# Patient Record
Sex: Female | Born: 1940 | Race: White | Hispanic: No | Smoking: Never smoker
Health system: Southern US, Community
[De-identification: ages and names within clinical notes are randomized; demographics above are authoritative.]

## PROBLEM LIST (undated history)

## (undated) DIAGNOSIS — F32A Depression, unspecified: Secondary | ICD-10-CM

## (undated) DIAGNOSIS — E785 Hyperlipidemia, unspecified: Secondary | ICD-10-CM

## (undated) DIAGNOSIS — Z9221 Personal history of antineoplastic chemotherapy: Secondary | ICD-10-CM

## (undated) DIAGNOSIS — K5790 Diverticulosis of intestine, part unspecified, without perforation or abscess without bleeding: Secondary | ICD-10-CM

## (undated) DIAGNOSIS — E119 Type 2 diabetes mellitus without complications: Secondary | ICD-10-CM

## (undated) DIAGNOSIS — F329 Major depressive disorder, single episode, unspecified: Secondary | ICD-10-CM

## (undated) DIAGNOSIS — K219 Gastro-esophageal reflux disease without esophagitis: Secondary | ICD-10-CM

## (undated) DIAGNOSIS — C801 Malignant (primary) neoplasm, unspecified: Secondary | ICD-10-CM

## (undated) DIAGNOSIS — M810 Age-related osteoporosis without current pathological fracture: Secondary | ICD-10-CM

## (undated) DIAGNOSIS — C50919 Malignant neoplasm of unspecified site of unspecified female breast: Secondary | ICD-10-CM

## (undated) HISTORY — DX: Age-related osteoporosis without current pathological fracture: M81.0

## (undated) HISTORY — DX: Type 2 diabetes mellitus without complications: E11.9

## (undated) HISTORY — DX: Gastro-esophageal reflux disease without esophagitis: K21.9

## (undated) HISTORY — PX: CATARACT EXTRACTION: SUR2

## (undated) HISTORY — DX: Diverticulosis of intestine, part unspecified, without perforation or abscess without bleeding: K57.90

## (undated) HISTORY — PX: ABDOMINAL HYSTERECTOMY: SHX81

## (undated) HISTORY — PX: BREAST SURGERY: SHX581

---

## 2007-08-11 ENCOUNTER — Ambulatory Visit: Payer: Self-pay | Admitting: Internal Medicine

## 2007-09-29 DIAGNOSIS — C801 Malignant (primary) neoplasm, unspecified: Secondary | ICD-10-CM

## 2007-09-29 DIAGNOSIS — C50919 Malignant neoplasm of unspecified site of unspecified female breast: Secondary | ICD-10-CM

## 2007-09-29 HISTORY — DX: Malignant neoplasm of unspecified site of unspecified female breast: C50.919

## 2007-09-29 HISTORY — PX: BREAST LUMPECTOMY: SHX2

## 2007-09-29 HISTORY — DX: Malignant (primary) neoplasm, unspecified: C80.1

## 2007-11-12 ENCOUNTER — Ambulatory Visit: Payer: Self-pay | Admitting: Family Medicine

## 2007-12-30 ENCOUNTER — Ambulatory Visit: Payer: Self-pay | Admitting: Family Medicine

## 2008-01-27 ENCOUNTER — Ambulatory Visit: Payer: Self-pay | Admitting: Oncology

## 2008-02-10 ENCOUNTER — Ambulatory Visit: Payer: Self-pay | Admitting: Oncology

## 2008-02-16 ENCOUNTER — Ambulatory Visit: Payer: Self-pay | Admitting: Surgery

## 2008-02-16 ENCOUNTER — Other Ambulatory Visit: Payer: Self-pay

## 2008-02-17 ENCOUNTER — Ambulatory Visit: Payer: Self-pay | Admitting: Surgery

## 2008-02-27 ENCOUNTER — Ambulatory Visit: Payer: Self-pay | Admitting: Oncology

## 2008-03-28 ENCOUNTER — Ambulatory Visit: Payer: Self-pay | Admitting: Oncology

## 2008-04-09 ENCOUNTER — Inpatient Hospital Stay: Payer: Self-pay | Admitting: Oncology

## 2008-04-09 ENCOUNTER — Other Ambulatory Visit: Payer: Self-pay

## 2008-04-22 ENCOUNTER — Inpatient Hospital Stay: Payer: Self-pay | Admitting: Oncology

## 2008-04-22 ENCOUNTER — Other Ambulatory Visit: Payer: Self-pay

## 2008-04-28 ENCOUNTER — Ambulatory Visit: Payer: Self-pay | Admitting: Oncology

## 2008-05-29 ENCOUNTER — Ambulatory Visit: Payer: Self-pay | Admitting: Oncology

## 2008-05-31 ENCOUNTER — Ambulatory Visit: Payer: Self-pay | Admitting: Surgery

## 2008-06-28 ENCOUNTER — Ambulatory Visit: Payer: Self-pay | Admitting: Oncology

## 2008-06-30 ENCOUNTER — Other Ambulatory Visit: Payer: Self-pay

## 2008-06-30 ENCOUNTER — Emergency Department: Payer: Self-pay | Admitting: Emergency Medicine

## 2008-07-29 ENCOUNTER — Ambulatory Visit: Payer: Self-pay | Admitting: Oncology

## 2008-08-28 ENCOUNTER — Ambulatory Visit: Payer: Self-pay | Admitting: Oncology

## 2008-09-28 ENCOUNTER — Ambulatory Visit: Payer: Self-pay | Admitting: Oncology

## 2008-10-02 ENCOUNTER — Ambulatory Visit: Payer: Self-pay | Admitting: Family Medicine

## 2008-10-05 ENCOUNTER — Ambulatory Visit: Payer: Self-pay | Admitting: Oncology

## 2008-10-29 ENCOUNTER — Ambulatory Visit: Payer: Self-pay | Admitting: Oncology

## 2008-12-27 ENCOUNTER — Ambulatory Visit: Payer: Self-pay | Admitting: Oncology

## 2008-12-28 ENCOUNTER — Emergency Department: Payer: Self-pay | Admitting: Emergency Medicine

## 2008-12-31 ENCOUNTER — Ambulatory Visit: Payer: Self-pay | Admitting: Oncology

## 2009-01-04 ENCOUNTER — Ambulatory Visit: Payer: Self-pay | Admitting: Oncology

## 2009-01-26 ENCOUNTER — Ambulatory Visit: Payer: Self-pay | Admitting: Oncology

## 2009-03-21 ENCOUNTER — Ambulatory Visit: Payer: Self-pay | Admitting: Ophthalmology

## 2009-03-29 ENCOUNTER — Ambulatory Visit: Payer: Self-pay | Admitting: Oncology

## 2009-04-02 ENCOUNTER — Ambulatory Visit: Payer: Self-pay | Admitting: Ophthalmology

## 2009-04-28 ENCOUNTER — Ambulatory Visit: Payer: Self-pay | Admitting: Oncology

## 2009-06-28 ENCOUNTER — Ambulatory Visit: Payer: Self-pay | Admitting: Oncology

## 2009-07-03 ENCOUNTER — Ambulatory Visit: Payer: Self-pay | Admitting: Oncology

## 2009-07-12 ENCOUNTER — Ambulatory Visit: Payer: Self-pay | Admitting: Oncology

## 2009-07-29 ENCOUNTER — Ambulatory Visit: Payer: Self-pay | Admitting: Oncology

## 2009-08-24 ENCOUNTER — Ambulatory Visit: Payer: Self-pay | Admitting: Internal Medicine

## 2009-09-28 ENCOUNTER — Ambulatory Visit: Payer: Self-pay | Admitting: Oncology

## 2009-10-11 ENCOUNTER — Ambulatory Visit: Payer: Self-pay | Admitting: Oncology

## 2009-10-23 ENCOUNTER — Ambulatory Visit: Payer: Self-pay | Admitting: Internal Medicine

## 2009-10-29 ENCOUNTER — Ambulatory Visit: Payer: Self-pay | Admitting: Oncology

## 2009-11-26 ENCOUNTER — Ambulatory Visit: Payer: Self-pay | Admitting: Oncology

## 2009-12-01 ENCOUNTER — Ambulatory Visit: Payer: Self-pay | Admitting: Family Medicine

## 2009-12-27 ENCOUNTER — Ambulatory Visit: Payer: Self-pay | Admitting: Oncology

## 2010-01-02 ENCOUNTER — Ambulatory Visit: Payer: Self-pay | Admitting: Oncology

## 2010-01-10 ENCOUNTER — Ambulatory Visit: Payer: Self-pay | Admitting: Oncology

## 2010-01-13 ENCOUNTER — Ambulatory Visit: Payer: Self-pay | Admitting: Internal Medicine

## 2010-01-26 ENCOUNTER — Ambulatory Visit: Payer: Self-pay | Admitting: Oncology

## 2010-03-28 ENCOUNTER — Ambulatory Visit: Payer: Self-pay | Admitting: Oncology

## 2010-04-04 ENCOUNTER — Ambulatory Visit: Payer: Self-pay | Admitting: Oncology

## 2010-04-28 ENCOUNTER — Ambulatory Visit: Payer: Self-pay | Admitting: Oncology

## 2010-04-28 ENCOUNTER — Ambulatory Visit: Payer: Self-pay | Admitting: Internal Medicine

## 2010-06-28 ENCOUNTER — Ambulatory Visit: Payer: Self-pay | Admitting: Oncology

## 2010-07-04 ENCOUNTER — Ambulatory Visit: Payer: Self-pay | Admitting: Oncology

## 2010-07-14 ENCOUNTER — Ambulatory Visit: Payer: Self-pay | Admitting: Gastroenterology

## 2010-07-16 LAB — PATHOLOGY REPORT

## 2010-07-29 ENCOUNTER — Ambulatory Visit: Payer: Self-pay | Admitting: Oncology

## 2011-01-09 ENCOUNTER — Ambulatory Visit: Payer: Self-pay | Admitting: Oncology

## 2011-01-27 ENCOUNTER — Ambulatory Visit: Payer: Self-pay | Admitting: Oncology

## 2011-02-27 ENCOUNTER — Ambulatory Visit: Payer: Self-pay | Admitting: Oncology

## 2011-05-08 ENCOUNTER — Ambulatory Visit: Payer: Self-pay

## 2011-07-23 ENCOUNTER — Ambulatory Visit: Payer: Self-pay | Admitting: Oncology

## 2011-08-05 ENCOUNTER — Ambulatory Visit: Payer: Self-pay | Admitting: Oncology

## 2011-08-06 LAB — CANCER ANTIGEN 27.29: CA 27.29: 28.9 U/mL (ref 0.0–38.6)

## 2011-08-29 ENCOUNTER — Ambulatory Visit: Payer: Self-pay | Admitting: Oncology

## 2011-08-30 ENCOUNTER — Ambulatory Visit: Payer: Self-pay | Admitting: Internal Medicine

## 2011-10-03 ENCOUNTER — Ambulatory Visit: Payer: Self-pay

## 2011-12-13 ENCOUNTER — Ambulatory Visit: Payer: Self-pay | Admitting: Family Medicine

## 2012-01-28 ENCOUNTER — Ambulatory Visit: Payer: Self-pay | Admitting: Oncology

## 2012-02-03 ENCOUNTER — Ambulatory Visit: Payer: Self-pay | Admitting: Oncology

## 2012-02-27 ENCOUNTER — Ambulatory Visit: Payer: Self-pay | Admitting: Oncology

## 2012-04-02 ENCOUNTER — Ambulatory Visit: Payer: Self-pay

## 2012-04-23 ENCOUNTER — Ambulatory Visit: Payer: Self-pay | Admitting: Medical

## 2012-08-10 ENCOUNTER — Ambulatory Visit: Payer: Self-pay | Admitting: Oncology

## 2012-08-21 ENCOUNTER — Ambulatory Visit: Payer: Self-pay | Admitting: Medical

## 2012-08-21 LAB — URINALYSIS, COMPLETE
Bacteria: NEGATIVE
Glucose,UR: NEGATIVE mg/dL (ref 0–75)
Leukocyte Esterase: NEGATIVE
Nitrite: NEGATIVE
Specific Gravity: 1.015 (ref 1.003–1.030)

## 2012-08-21 LAB — AMYLASE: Amylase: 29 U/L (ref 25–115)

## 2012-08-21 LAB — COMPREHENSIVE METABOLIC PANEL
Albumin: 3.3 g/dL — ABNORMAL LOW (ref 3.4–5.0)
Alkaline Phosphatase: 63 U/L (ref 50–136)
BUN: 19 mg/dL — ABNORMAL HIGH (ref 7–18)
Bilirubin,Total: 0.5 mg/dL (ref 0.2–1.0)
Co2: 26 mmol/L (ref 21–32)
Creatinine: 0.82 mg/dL (ref 0.60–1.30)
EGFR (Non-African Amer.): >60
Glucose: 161 mg/dL — ABNORMAL HIGH (ref 65–99)
Potassium: 3.8 mmol/L (ref 3.5–5.1)
SGPT (ALT): 22 U/L (ref 12–78)
Sodium: 138 mmol/L (ref 136–145)

## 2012-08-21 LAB — CBC WITH DIFFERENTIAL/PLATELET
Eosinophil #: 0 10*3/uL (ref 0.0–0.7)
Eosinophil %: 0.5 %
HGB: 12.4 g/dL (ref 12.0–16.0)
Lymphocyte #: 0.4 10*3/uL — ABNORMAL LOW (ref 1.0–3.6)
MCHC: 34.2 g/dL (ref 32.0–36.0)
Monocyte #: 0.6 x10 3/mm (ref 0.2–0.9)
Neutrophil %: 75.6 %
Platelet: 145 10*3/uL — ABNORMAL LOW (ref 150–440)
RDW: 12.5 % (ref 11.5–14.5)

## 2012-08-21 LAB — LIPASE, BLOOD: Lipase: 102 U/L (ref 73–393)

## 2012-08-28 ENCOUNTER — Ambulatory Visit: Payer: Self-pay | Admitting: Oncology

## 2012-10-08 ENCOUNTER — Ambulatory Visit: Payer: Self-pay | Admitting: Family Medicine

## 2012-10-08 LAB — URINALYSIS, COMPLETE
Bilirubin,UR: NEGATIVE
Ketone: NEGATIVE
RBC,UR: NONE SEEN /HPF (ref 0–5)

## 2013-01-31 ENCOUNTER — Ambulatory Visit: Payer: Self-pay | Admitting: Oncology

## 2013-02-08 ENCOUNTER — Ambulatory Visit: Payer: Self-pay | Admitting: Oncology

## 2013-02-09 LAB — CANCER ANTIGEN 27.29: CA 27.29: 36 U/mL (ref 0.0–38.6)

## 2013-02-26 ENCOUNTER — Ambulatory Visit: Payer: Self-pay | Admitting: Oncology

## 2013-05-17 ENCOUNTER — Ambulatory Visit: Payer: Self-pay | Admitting: Family Medicine

## 2013-05-25 ENCOUNTER — Ambulatory Visit: Payer: Self-pay | Admitting: Family Medicine

## 2013-06-28 ENCOUNTER — Ambulatory Visit: Payer: Self-pay | Admitting: Oncology

## 2013-08-16 ENCOUNTER — Ambulatory Visit: Payer: Self-pay | Admitting: Oncology

## 2013-08-17 LAB — CANCER ANTIGEN 27.29: CA 27.29: 40.5 U/mL — ABNORMAL HIGH (ref 0.0–38.6)

## 2013-08-28 ENCOUNTER — Ambulatory Visit: Payer: Self-pay | Admitting: Oncology

## 2013-09-16 ENCOUNTER — Ambulatory Visit: Payer: Self-pay | Admitting: Physician Assistant

## 2013-09-16 LAB — SEDIMENTATION RATE: Erythrocyte Sed Rate: 14 mm/hr (ref 0–30)

## 2014-02-15 ENCOUNTER — Ambulatory Visit: Payer: Self-pay | Admitting: Oncology

## 2014-02-21 ENCOUNTER — Ambulatory Visit: Payer: Self-pay | Admitting: Oncology

## 2014-02-22 LAB — CANCER ANTIGEN 27.29: CA 27.29: 45.2 U/mL — AB (ref 0.0–38.6)

## 2014-02-26 ENCOUNTER — Ambulatory Visit: Payer: Self-pay | Admitting: Oncology

## 2014-05-02 ENCOUNTER — Ambulatory Visit: Payer: Self-pay | Admitting: Oncology

## 2014-05-03 LAB — CANCER ANTIGEN 27.29: CA 27.29: 51.5 U/mL — AB (ref 0.0–38.6)

## 2014-05-27 ENCOUNTER — Ambulatory Visit: Payer: Self-pay | Admitting: Internal Medicine

## 2014-05-27 LAB — URINALYSIS, COMPLETE
Bilirubin,UR: NEGATIVE
GLUCOSE, UR: NEGATIVE
KETONE: NEGATIVE
Nitrite: NEGATIVE
Ph: 7 (ref 5.0–8.0)
Protein: NEGATIVE
SPECIFIC GRAVITY: 1.01 (ref 1.000–1.030)
WBC UR: >30 /HPF (ref 0–5)

## 2014-05-29 ENCOUNTER — Ambulatory Visit: Payer: Self-pay | Admitting: Oncology

## 2014-05-31 LAB — URINE CULTURE

## 2014-06-23 ENCOUNTER — Ambulatory Visit: Payer: Self-pay | Admitting: Physician Assistant

## 2014-06-29 ENCOUNTER — Ambulatory Visit: Payer: Self-pay | Admitting: Oncology

## 2014-07-04 ENCOUNTER — Ambulatory Visit: Payer: Self-pay | Admitting: Oncology

## 2014-07-05 LAB — CANCER ANTIGEN 27.29: CA 27.29: 48.1 U/mL — ABNORMAL HIGH (ref 0.0–38.6)

## 2014-07-22 ENCOUNTER — Ambulatory Visit: Payer: Self-pay | Admitting: Internal Medicine

## 2014-07-29 ENCOUNTER — Ambulatory Visit: Payer: Self-pay | Admitting: Oncology

## 2014-10-04 ENCOUNTER — Ambulatory Visit: Payer: Self-pay | Admitting: Oncology

## 2015-01-04 ENCOUNTER — Ambulatory Visit
Admit: 2015-01-04 | Disposition: A | Discharge status: Home / Self Care | Payer: Self-pay | Attending: Oncology | Admitting: Oncology

## 2015-01-05 LAB — CANCER ANTIGEN 27.29: CA 27.29: 43.4 U/mL — ABNORMAL HIGH (ref 0.0–38.6)

## 2015-02-10 ENCOUNTER — Ambulatory Visit
Admission: EM | Admit: 2015-02-10 | Discharge: 2015-02-10 | Disposition: A | Discharge status: Home / Self Care | Payer: Medicare Other | Attending: Family Medicine | Admitting: Family Medicine

## 2015-02-10 ENCOUNTER — Encounter: Payer: Self-pay | Admitting: Gynecology

## 2015-02-10 DIAGNOSIS — R51 Headache: Secondary | ICD-10-CM

## 2015-02-10 DIAGNOSIS — R519 Headache, unspecified: Secondary | ICD-10-CM

## 2015-02-10 DIAGNOSIS — H6981 Other specified disorders of Eustachian tube, right ear: Secondary | ICD-10-CM | POA: Diagnosis not present

## 2015-02-10 DIAGNOSIS — J0100 Acute maxillary sinusitis, unspecified: Secondary | ICD-10-CM

## 2015-02-10 HISTORY — DX: Hyperlipidemia, unspecified: E78.5

## 2015-02-10 HISTORY — DX: Depression, unspecified: F32.A

## 2015-02-10 HISTORY — DX: Malignant (primary) neoplasm, unspecified: C80.1

## 2015-02-10 HISTORY — DX: Major depressive disorder, single episode, unspecified: F32.9

## 2015-02-10 MED ORDER — FEXOFENADINE-PSEUDOEPHED ER 60-120 MG PO TB12: 1.0000 | ORAL_TABLET | Freq: Two times a day (BID) | ORAL | Status: DC

## 2015-02-10 MED ORDER — FLUTICASONE PROPIONATE 50 MCG/ACT NA SUSP: 2.0000 | Freq: Every day | NASAL | Status: DC

## 2015-02-10 MED ORDER — AMOXICILLIN-POT CLAVULANATE 875-125 MG PO TABS: 1.0000 | ORAL_TABLET | Freq: Two times a day (BID) | ORAL | Status: DC

## 2015-02-10 NOTE — Discharge Instructions (Signed)
Sinusitis Sinusitis is redness, soreness, and puffiness (inflammation) of the air pockets in the bones of your face (sinuses). The redness, soreness, and puffiness can cause air and mucus to get trapped in your sinuses. This can allow germs to grow and cause an infection.  HOME CARE   Drink enough fluids to keep your pee (urine) clear or pale yellow.  Use a humidifier in your home.  Run a hot shower to create steam in the bathroom. Sit in the bathroom with the door closed. Breathe in the steam 3-4 times a day.  Put a warm, moist washcloth on your face 3-4 times a day, or as told by your doctor.  Use salt water sprays (saline sprays) to wet the thick fluid in your nose. This can help the sinuses drain.  Only take medicine as told by your doctor. GET HELP RIGHT AWAY IF:   Your pain gets worse.  You have very bad headaches.  You are sick to your stomach (nauseous).  You throw up (vomit).  You are very sleepy (drowsy) all the time.  Your face is puffy (swollen).  Your vision changes.  You have a stiff neck.  You have trouble breathing. MAKE SURE YOU:   Understand these instructions.  Will watch your condition.  Will get help right away if you are not doing well or get worse. Document Released: 03/02/2008 Document Revised: 06/08/2012 Document Reviewed: 04/19/2012 [facility] Patient Information 2015 [place], [place]. This information is not intended to replace advice given to you by your health care provider. Make sure you discuss any questions you have with your health care provider. Barotitis Media Barotitis media is inflammation of your middle ear. This occurs when the auditory tube (eustachian tube) leading from the back of your nose (nasopharynx) to your eardrum is blocked. This blockage may result from a cold, environmental allergies, or an upper respiratory infection. Unresolved barotitis media may lead to damage or hearing loss (barotrauma), which may become  permanent. HOME CARE INSTRUCTIONS   Use medicines as recommended by your health care provider. Over-the-counter medicines will help unblock the canal and can help during times of air travel.  Do not put anything into your ears to clean or unplug them. Eardrops will not be helpful.  Do not swim, dive, or fly until your health care provider says it is all right to do so. If these activities are necessary, chewing gum with frequent, forceful swallowing may help. It is also helpful to hold your nose and gently blow to pop your ears for equalizing pressure changes. This forces air into the eustachian tube.  Only take over-the-counter or prescription medicines for pain, discomfort, or fever as directed by your health care provider.  A decongestant may be helpful in decongesting the middle ear and make pressure equalization easier. SEEK MEDICAL CARE IF:  You experience a serious form of dizziness in which you feel as if the room is spinning and you feel nauseated (vertigo).  Your symptoms only involve one ear. SEEK IMMEDIATE MEDICAL CARE IF:   You develop a severe headache, dizziness, or severe ear pain.  You have bloody or pus-like drainage from your ears.  You develop a fever.  Your problems do not improve or become worse. MAKE SURE YOU:   Understand these instructions.  Will watch your condition.  Will get help right away if you are not doing well or get worse. Document Released: 09/11/2000 Document Revised: 07/05/2013 Document Reviewed: 04/11/2013 [facility] Patient Information 2015 [place], [place]. This information is  not intended to replace advice given to you by your health care provider. Make sure you discuss any questions you have with your health care provider.

## 2015-02-10 NOTE — ED Provider Notes (Signed)
CSN: [id number]     Arrival date & time 02/10/15  0803 History   First MD Initiated Contact with Patient 02/10/15 [phone]     Chief Complaint  Patient presents with  . Headache  . Otalgia   (Consider location/radiation/quality/duration/timing/severity/associated sxs/prior Treatment) Patient is a 74 y.o. female presenting with headaches and ear pain. The history is provided by the patient. No language interpreter was used.  Headache Pain location:  Frontal Quality:  Dull Radiates to:  Eyes and face Onset quality:  Unable to specify Duration:  4 days Timing:  Intermittent Progression:  Unchanged Chronicity:  New Similar to prior headaches: no   Context: activity   Relieved by:  Nothing Worsened by:  Nothing Ineffective treatments:  None tried Associated symptoms: congestion, drainage and ear pain   Congestion:    Location:  Nasal   Interferes with sleep: no     Interferes with eating/drinking: no   Risk factors: no anger, no family hx of SAH and does not have insomnia   Otalgia Associated symptoms: congestion and headaches     Past Medical History  Diagnosis Date  . Cancer     breast   . Depression   . Hyperlipemia    Past Surgical History  Procedure Laterality Date  . Breast surgery      left breast cancer  . Abdominal hysterectomy     Family History  Problem Relation Age of Onset  . Cancer Father    History  Substance Use Topics  . Smoking status: Never Smoker   . Smokeless tobacco: Not on file  . Alcohol Use: No   OB History    No data available     Review of Systems  HENT: Positive for congestion, ear pain and postnasal drip.   Neurological: Positive for headaches.    Allergies  Review of patient's allergies indicates no known allergies.  Home Medications   Prior to Admission medications   Medication Sig Start Date End Date Taking? Authorizing Provider  citalopram (CELEXA) 40 MG tablet Take 40 mg by mouth daily.   Yes Historical Provider, MD   amoxicillin-clavulanate (AUGMENTIN) 875-125 MG per tablet Take 1 tablet by mouth 2 (two) times daily. 02/10/15   Frederich Cha, MD  fexofenadine-pseudoephedrine (ALLEGRA-D) 60-120 MG per tablet Take 1 tablet by mouth every 12 (twelve) hours. 02/10/15   Frederich Cha, MD  fluticasone (FLONASE) 50 MCG/ACT nasal spray Place 2 sprays into both nostrils daily. 02/10/15   Frederich Cha, MD   BP 117/72 mmHg  Pulse 78  Temp(Src) 96.5 F (35.8 C) (Tympanic)  Ht 4\' 10"  (1.473 m)  Wt 123 lb (55.792 kg)  BMI 25.71 kg/m2  SpO2 98% Physical Exam  Constitutional: She is oriented to person, place, and time. She appears well-nourished.  HENT:  Head: Normocephalic.  Right Ear: Tympanic membrane is bulging. Tympanic membrane is not perforated and not erythematous.  Left Ear: Tympanic membrane is not perforated and not erythematous.  Nose: Mucosal edema present. Right sinus exhibits maxillary sinus tenderness. Left sinus exhibits maxillary sinus tenderness.  Mouth/Throat: No oral lesions. No dental abscesses, uvula swelling, lacerations or dental caries.  Eyes: Pupils are equal, round, and reactive to light.  Neck: Neck supple.  Neurological: She is oriented to person, place, and time.    ED Course  Procedures (including critical care time) Labs Review Labs Reviewed - No data to display  Imaging Review No results found.   Pa she has been informed that she has eustachian tube dysfunction.  Normal decongestant nasal spray does track well and antibiotic is having a headache.  MDM   1. Eustachian tube dysfunction, right   2. Acute nonintractable headache, unspecified headache type   3. Acute maxillary sinusitis, recurrence not specified        Frederich Cha, MD 02/10/15 [phone]

## 2015-02-10 NOTE — ED Notes (Signed)
Patient c/o right ear pain and headaches x 3-4 days.

## 2015-02-24 ENCOUNTER — Encounter: Payer: Self-pay | Admitting: Gynecology

## 2015-02-24 ENCOUNTER — Ambulatory Visit
Admission: EM | Admit: 2015-02-24 | Discharge: 2015-02-24 | Disposition: A | Discharge status: Home / Self Care | Payer: Medicare Other | Attending: Family Medicine | Admitting: Family Medicine

## 2015-02-24 DIAGNOSIS — R51 Headache: Secondary | ICD-10-CM | POA: Diagnosis not present

## 2015-02-24 DIAGNOSIS — R05 Cough: Secondary | ICD-10-CM

## 2015-02-24 DIAGNOSIS — R519 Headache, unspecified: Secondary | ICD-10-CM

## 2015-02-24 DIAGNOSIS — R059 Cough, unspecified: Secondary | ICD-10-CM

## 2015-02-24 MED ORDER — HYDROCOD POLST-CPM POLST ER 10-8 MG/5ML PO SUER: 5.0000 mL | Freq: Two times a day (BID) | ORAL | Status: DC

## 2015-02-24 NOTE — ED Notes (Signed)
Patient c/o of coughing and headaches x 1 week. Pt. Also stated notice bumps under her tongue.

## 2015-02-24 NOTE — ED Provider Notes (Signed)
CSN: [id number]     Arrival date & time 02/24/15  0803 History   First MD Initiated Contact with Patient 02/24/15 [phone]     Chief Complaint  Patient presents with  . Cough  . Headache   (Consider location/radiation/quality/duration/timing/severity/associated sxs/prior Treatment) HPI Comments: 74 yo female presents with a 1-2 weeks h/o cough and headaches. Patient was seen about 2 weeks ago and was treated for a sinusitis with Augmentin which she just finished about 5 days ago. States some of those symptoms have improved but continues with headaches and cough. Cough is dry, non-productive.  Denies any fevers, chills, chest pains, shortness of breath, vision problems, numbness, tingling, one-sided weakness, speech or swallowing problems.   The history is provided by the patient.    Past Medical History  Diagnosis Date  . Cancer     breast   . Depression   . Hyperlipemia    Past Surgical History  Procedure Laterality Date  . Breast surgery      left breast cancer  . Abdominal hysterectomy     Family History  Problem Relation Age of Onset  . Cancer Father    History  Substance Use Topics  . Smoking status: Never Smoker   . Smokeless tobacco: Not on file  . Alcohol Use: No   OB History    No data available     Review of Systems  Allergies  Review of patient's allergies indicates no known allergies.  Home Medications   Prior to Admission medications   Medication Sig Start Date End Date Taking? Authorizing Provider  citalopram (CELEXA) 40 MG tablet Take 40 mg by mouth daily.   Yes Historical Provider, MD  fexofenadine-pseudoephedrine (ALLEGRA-D) 60-120 MG per tablet Take 1 tablet by mouth every 12 (twelve) hours. 02/10/15  Yes Frederich Cha, MD  fluticasone [facility]) 50 MCG/ACT nasal spray Place 2 sprays into both nostrils daily. 02/10/15  Yes Frederich Cha, MD  amoxicillin-clavulanate (AUGMENTIN) 875-125 MG per tablet Take 1 tablet by mouth 2 (two) times daily. 02/10/15    Frederich Cha, MD  chlorpheniramine-HYDROcodone [facility] PENNKINETIC ER) 10-8 MG/5ML SUER Take 5 mLs by mouth 2 (two) times daily. 02/24/15   Norval Gable, MD   BP 141/67 mmHg  Pulse 72  Temp(Src) 98.9 F (37.2 C) (Oral)  Ht 4\' 11"  (1.499 m)  Wt 121 lb (54.885 kg)  BMI 24.43 kg/m2  SpO2 98% Physical Exam  Constitutional: She is oriented to person, place, and time. She appears well-developed and well-nourished. No distress.  HENT:  Head: Normocephalic.  Right Ear: Tympanic membrane, external ear and ear canal normal.  Left Ear: Tympanic membrane, external ear and ear canal normal.  Nose: Nose normal.  Mouth/Throat: Oropharynx is clear and moist and mucous membranes are normal.  Eyes: Conjunctivae and EOM are normal. Pupils are equal, round, and reactive to light. Right eye exhibits no discharge. Left eye exhibits no discharge. No scleral icterus.  Neck: Normal range of motion. Neck supple. No JVD present. No tracheal deviation present. No thyromegaly present.  Cardiovascular: Normal rate, regular rhythm, normal heart sounds and intact distal pulses.   No murmur heard. Pulmonary/Chest: Effort normal and breath sounds normal. No stridor. No respiratory distress. She has no wheezes. She has no rales.  Lymphadenopathy:    She has no cervical adenopathy.  Neurological: She is alert and oriented to person, place, and time. She displays normal reflexes. No cranial nerve deficit. She exhibits normal muscle tone. Coordination normal.  Skin: Skin is warm and  dry. No rash noted. She is not diaphoretic. No erythema.  Vitals reviewed.   ED Course  Procedures (including critical care time) Labs Review Labs Reviewed - No data to display  Imaging Review No results found.   MDM   1. Cough   2. Headache disorder    Discharge Medication List as of 02/24/2015  9:05 AM    START taking these medications   Details  chlorpheniramine-HYDROcodone (TUSSIONEX PENNKINETIC ER) 10-8 MG/5ML SUER Take  5 mLs by mouth 2 (two) times daily., Starting 02/24/2015, Until Discontinued, Normal      Plan: 1. Test/x-ray results and diagnosis reviewed with patient 2. rx as per orders; risks, benefits, potential side effects reviewed with patient 3. Recommend supportive treatment with  4. F/u prn if symptoms worsen or don't improve    Norval Gable, MD 02/24/15 [phone]

## 2015-05-13 ENCOUNTER — Other Ambulatory Visit: Payer: Self-pay | Admitting: *Deleted

## 2015-05-13 DIAGNOSIS — C50919 Malignant neoplasm of unspecified site of unspecified female breast: Secondary | ICD-10-CM

## 2015-08-24 ENCOUNTER — Encounter: Payer: Self-pay | Admitting: Gynecology

## 2015-08-24 ENCOUNTER — Ambulatory Visit
Admission: EM | Admit: 2015-08-24 | Discharge: 2015-08-24 | Disposition: A | Discharge status: Home / Self Care | Payer: Medicare Other | Attending: Family Medicine | Admitting: Family Medicine

## 2015-08-24 DIAGNOSIS — R51 Headache: Secondary | ICD-10-CM | POA: Diagnosis not present

## 2015-08-24 DIAGNOSIS — J0100 Acute maxillary sinusitis, unspecified: Secondary | ICD-10-CM

## 2015-08-24 DIAGNOSIS — R600 Localized edema: Secondary | ICD-10-CM

## 2015-08-24 DIAGNOSIS — R21 Rash and other nonspecific skin eruption: Secondary | ICD-10-CM | POA: Diagnosis not present

## 2015-08-24 DIAGNOSIS — R519 Headache, unspecified: Secondary | ICD-10-CM

## 2015-08-24 MED ORDER — FEXOFENADINE-PSEUDOEPHED ER 180-240 MG PO TB24: 1.0000 | ORAL_TABLET | Freq: Every day | ORAL | Status: DC

## 2015-08-24 MED ORDER — AMOXICILLIN-POT CLAVULANATE 875-125 MG PO TABS: 1.0000 | ORAL_TABLET | Freq: Two times a day (BID) | ORAL | Status: DC

## 2015-08-24 MED ORDER — PREDNISONE 10 MG (21) PO TBPK: ORAL_TABLET | ORAL | Status: DC

## 2015-08-24 NOTE — ED Provider Notes (Signed)
CSN: [id number]     Arrival date & time 08/24/15  [id number] History   First MD Initiated Contact with Patient 08/24/15 0830     Nurses notes were reviewed. Chief Complaint  Patient presents with  . Facial Pain  . Rash   patient is here several complaints. She states she is having sinus pressure and sinus pain. However when trying to clarify what she asked me about sinus pain Sunday say much. She states that she's had trouble also normal when asked exactly when this gets worse she can't really give me any strong details. Apparently back in the early spring she was seen for nasal congestion placed on Augmentin and Allegra-D and improved. Ask about whether this is all allergies and she can't really say.   She is also complaining of swelling in her left leg. This is the side that she had breast cancer on but has no history of hypertension and she can't would tell me how long that left leg has been swelling. Asked whether she's had any electrolytes or blood done lately she has not she's not seen a primary care doctor and she states until last year 2015 and she states she needs to make appointment to be seen sometime in 2016 which she is almost over with now. She also states she has a rash on the left lower leg looks like this more of a pruritic rash is red and where she may been scratching (Consider location/radiation/quality/duration/timing/severity/associated sxs/prior Treatment) Patient is a 74 y.o. female presenting with rash and URI. The history is provided by the patient.  Rash Location:  Leg Leg rash location:  L leg Quality: swelling   Severity:  Moderate Onset quality:  Sudden Timing:  Unable to specify Progression:  Unable to specify Chronicity:  New Context: not animal contact, not chemical exposure, not exposure to similar rash, not plant contact, not pollen, not pregnancy, not sick contacts and not sun exposure   Ineffective treatments:  None tried Associated symptoms: headaches    Associated symptoms: no fever and no sore throat   URI Presenting symptoms: congestion, ear pain and rhinorrhea   Presenting symptoms: no fever and no sore throat   Congestion:    Location:  Nasal   Interferes with sleep: no   Onset quality:  Unable to specify Timing:  Unable to specify Chronicity:  Chronic Relieved by:  Nothing Worsened by:  Nothing tried Associated symptoms: headaches     Past Medical History  Diagnosis Date  . Cancer ([place])     breast   . Depression   . Hyperlipemia    Past Surgical History  Procedure Laterality Date  . Breast surgery      left breast cancer  . Abdominal hysterectomy     Family History  Problem Relation Age of Onset  . Cancer Father    Social History  Substance Use Topics  . Smoking status: Never Smoker   . Smokeless tobacco: None  . Alcohol Use: No   OB History    No data available     Review of Systems  Constitutional: Negative for fever.  HENT: Positive for congestion, ear pain and rhinorrhea. Negative for sore throat.   Skin: Positive for rash.  Neurological: Positive for headaches.    Allergies  Review of patient's allergies indicates no known allergies.  Home Medications   Prior to Admission medications   Medication Sig Start Date End Date Taking? Authorizing Provider  Calcium Carbonate-Vit D-Min (CALCIUM 1200 PO) Take by mouth.  Yes Historical Provider, MD  citalopram (CELEXA) 40 MG tablet Take 40 mg by mouth daily.   Yes Historical Provider, MD  fexofenadine-pseudoephedrine (ALLEGRA-D) 60-120 MG per tablet Take 1 tablet by mouth every 12 (twelve) hours. 02/10/15  Yes Frederich Cha, MD  amoxicillin-clavulanate (AUGMENTIN) 875-125 MG per tablet Take 1 tablet by mouth 2 (two) times daily. 02/10/15   Frederich Cha, MD  amoxicillin-clavulanate (AUGMENTIN) 875-125 MG tablet Take 1 tablet by mouth 2 (two) times daily. 08/24/15   Frederich Cha, MD  chlorpheniramine-HYDROcodone [facility] PENNKINETIC ER) 10-8 MG/5ML SUER Take  5 mLs by mouth 2 (two) times daily. 02/24/15   Norval Gable, MD  fexofenadine-pseudoephedrine (ALLEGRA-D [facility]ERGY [facility] CONGESTION) 180-240 MG 24 hr tablet Take 1 tablet by mouth daily. 08/24/15   Frederich Cha, MD  fluticasone (FLONASE) 50 MCG/ACT nasal spray Place 2 sprays into both nostrils daily. 02/10/15   Frederich Cha, MD  predniSONE (STERAPRED UNI-PAK 21 TAB) 10 MG (21) TBPK tablet Sig 6 tablet day 1, 5 tablets day 2, 4 tablets day 3,,3tablets day 4, 2 tablets day 5, 1 tablet day 6 take all tablets orally 08/24/15   Frederich Cha, MD   Meds Ordered and Administered this Visit  Medications - No data to display  BP 133/54 mmHg  Pulse 66  Temp(Src) 98.7 F (37.1 C) (Oral)  Resp 16  Wt 121 lb (54.885 kg)  SpO2 98% No data found.   Physical Exam  Constitutional: She is oriented to person, place, and time. She appears well-developed and well-nourished.  HENT:  Head: Normocephalic and atraumatic.  Nose: Nose normal.  Eyes: Pupils are equal, round, and reactive to light.  Neck: Neck supple.  Cardiovascular: Normal rate, regular rhythm and normal heart sounds.   Pulmonary/Chest: Effort normal and breath sounds normal.  Musculoskeletal: Normal range of motion. She exhibits edema and tenderness.  Neurological: She is alert and oriented to person, place, and time.  Skin: Rash noted.  Psychiatric: She has a normal mood and affect.  Vitals reviewed.   ED Course  Procedures (including critical care time)  Labs Review Labs Reviewed - No data to display  Imaging Review No results found.   Visual Acuity Review  Right Eye Distance:   Left Eye Distance:   Bilateral Distance:    Right Eye Near:   Left Eye Near:    Bilateral Near:         MDM   1. Acute maxillary sinusitis, recurrence not specified   2. Facial pain   3. Rash and nonspecific skin eruption   4. Leg edema, left      #1 sinusitis/ facial pain. Despite the lack of history obtained appears that Augmentin we'll  placed on that in this early spring helped we'll go ahead and place her on another round of Augmentin and Allegra-D. Also concerned about the swelling of the face with the steroids may be helpful.  #2 leg swelling questions whether the leg is swollen and she is scratching the leg and now vastus gotten the leg red or whether she has a rash on leg and this was causing swelling. Since she does have a question of allergies I will place her on a six-day course of prednisone sit-ups the rash may such great help swelling I do not feel couple trying a diuretic at this time since we have no lab work and is no signs of hypertension run stress strongly that she talk with her PCP and see him for reevaluation.  Frederich Cha, MD 08/24/15 [phone]

## 2015-08-24 NOTE — Discharge Instructions (Signed)
Allergies An allergy is when your body reacts to a substance in a way that is not normal. An allergic reaction can happen after you:  Eat something.  Breathe in something.  Touch something. WHAT KINDS OF ALLERGIES ARE THERE? You can be allergic to:  Things that are only around during certain seasons, like molds and pollens.  Foods.  Drugs.  Insects.  Animal dander. WHAT ARE SYMPTOMS OF ALLERGIES?  Puffiness (swelling). This may happen on the lips, face, tongue, mouth, or throat.  Sneezing.  Coughing.  Breathing loudly (wheezing).  Stuffy nose.  Tingling in the mouth.  A rash.  Itching.  Itchy, red, puffy areas of skin (hives).  Watery eyes.  Throwing up (vomiting).  Watery poop (diarrhea).  Dizziness.  Feeling faint or fainting.  Trouble breathing or swallowing.  A tight feeling in the chest.  A fast heartbeat. HOW ARE ALLERGIES DIAGNOSED? Allergies can be diagnosed with:  A medical and family history.  Skin tests.  Blood tests.  A food diary. A food diary is a record of all the foods, drinks, and symptoms you have each day.  The results of an elimination diet. This diet involves making sure not to eat certain foods and then seeing what happens when you start eating them again. HOW ARE ALLERGIES TREATED? There is no cure for allergies, but allergic reactions can be treated with medicine. Severe reactions usually need to be treated at a hospital.  HOW CAN REACTIONS BE PREVENTED? The best way to prevent an allergic reaction is to avoid the thing you are allergic to. Allergy shots and medicines can also help prevent reactions in some cases.   This information is not intended to replace advice given to you by your health care provider. Make sure you discuss any questions you have with your health care provider.   Document Released: 01/09/2013 Document Revised: 10/05/2014 Document Reviewed: 06/26/2014 Elsevier Interactive Patient Education 2016  [employer]  Edema Edema is an abnormal buildup of fluids. It is more common in your legs and thighs. Painless swelling of the feet and ankles is more likely as a person ages. It also is common in looser skin, like around your eyes. HOME CARE   Keep the affected body part above the level of the heart while lying down.  Do not sit still or stand for a long time.  Do not put anything right under your knees when you lie down.  Do not wear tight clothes on your upper legs.  Exercise your legs to help the puffiness (swelling) go down.  Wear elastic bandages or support stockings as told by your doctor.  A low-salt diet may help lessen the puffiness.  Only take medicine as told by your doctor. GET HELP IF:  Treatment is not working.  You have heart, liver, or kidney disease and notice that your skin looks puffy or shiny.  You have puffiness in your legs that does not get better when you raise your legs.  You have sudden weight gain for no reason. GET HELP RIGHT AWAY IF:   You have shortness of breath or chest pain.  You cannot breathe when you lie down.  You have pain, redness, or warmth in the areas that are puffy.  You have heart, liver, or kidney disease and get edema all of a sudden.  You have a fever and your symptoms get worse all of a sudden. MAKE SURE YOU:   Understand these instructions.  Will watch your condition.  Will  get help right away if you are not doing well or get worse.   This information is not intended to replace advice given to you by your health care provider. Make sure you discuss any questions you have with your health care provider.   Document Released: 03/02/2008 Document Revised: 09/19/2013 Document Reviewed: 07/07/2013 Elsevier Interactive Patient Education 2016 Elsevier Inc.  Sinusitis, Adult Sinusitis is redness, soreness, and puffiness (inflammation) of the air pockets in the bones of your face (sinuses). The redness, soreness, and  puffiness can cause air and mucus to get trapped in your sinuses. This can allow germs to grow and cause an infection.  HOME CARE   Drink enough fluids to keep your pee (urine) clear or pale yellow.  Use a humidifier in your home.  Run a hot shower to create steam in the bathroom. Sit in the bathroom with the door closed. Breathe in the steam 3-4 times a day.  Put a warm, moist washcloth on your face 3-4 times a day, or as told by your doctor.  Use salt water sprays (saline sprays) to wet the thick fluid in your nose. This can help the sinuses drain.  Only take medicine as told by your doctor. GET HELP RIGHT AWAY IF:   Your pain gets worse.  You have very bad headaches.  You are sick to your stomach (nauseous).  You throw up (vomit).  You are very sleepy (drowsy) all the time.  Your face is puffy (swollen).  Your vision changes.  You have a stiff neck.  You have trouble breathing. MAKE SURE YOU:   Understand these instructions.  Will watch your condition.  Will get help right away if you are not doing well or get worse.   This information is not intended to replace advice given to you by your health care provider. Make sure you discuss any questions you have with your health care provider.   Document Released: 03/02/2008 Document Revised: 10/05/2014 Document Reviewed: 04/19/2012 Elsevier Interactive Patient Education [employer].

## 2015-08-24 NOTE — ED Notes (Signed)
Patient c/o sinus infection and rash/swelling on left ankle x 2 weeks.

## 2016-01-02 ENCOUNTER — Other Ambulatory Visit: Payer: Self-pay | Admitting: Oncology

## 2016-01-02 DIAGNOSIS — C50912 Malignant neoplasm of unspecified site of left female breast: Secondary | ICD-10-CM

## 2016-01-03 ENCOUNTER — Inpatient Hospital Stay: Payer: Medicare Other | Attending: Oncology

## 2016-01-03 ENCOUNTER — Ambulatory Visit (HOSPITAL_BASED_OUTPATIENT_CLINIC_OR_DEPARTMENT_OTHER): Payer: Medicare Other | Admitting: Oncology

## 2016-01-03 ENCOUNTER — Encounter: Payer: Self-pay | Admitting: Oncology

## 2016-01-03 VITALS — BP 137/84 | HR 65 | Temp 97.3°F | Resp 16 | Wt 130.1 lb

## 2016-01-03 DIAGNOSIS — Z79899 Other long term (current) drug therapy: Secondary | ICD-10-CM | POA: Insufficient documentation

## 2016-01-03 DIAGNOSIS — K219 Gastro-esophageal reflux disease without esophagitis: Secondary | ICD-10-CM | POA: Diagnosis not present

## 2016-01-03 DIAGNOSIS — K5790 Diverticulosis of intestine, part unspecified, without perforation or abscess without bleeding: Secondary | ICD-10-CM | POA: Diagnosis not present

## 2016-01-03 DIAGNOSIS — E119 Type 2 diabetes mellitus without complications: Secondary | ICD-10-CM | POA: Diagnosis not present

## 2016-01-03 DIAGNOSIS — E785 Hyperlipidemia, unspecified: Secondary | ICD-10-CM | POA: Insufficient documentation

## 2016-01-03 DIAGNOSIS — Z170 Estrogen receptor positive status [ER+]: Secondary | ICD-10-CM

## 2016-01-03 DIAGNOSIS — Z86718 Personal history of other venous thrombosis and embolism: Secondary | ICD-10-CM | POA: Insufficient documentation

## 2016-01-03 DIAGNOSIS — Z9223 Personal history of estrogen therapy: Secondary | ICD-10-CM

## 2016-01-03 DIAGNOSIS — Z853 Personal history of malignant neoplasm of breast: Secondary | ICD-10-CM | POA: Diagnosis present

## 2016-01-03 DIAGNOSIS — C50912 Malignant neoplasm of unspecified site of left female breast: Secondary | ICD-10-CM

## 2016-01-03 DIAGNOSIS — Z800 Family history of malignant neoplasm of digestive organs: Secondary | ICD-10-CM | POA: Insufficient documentation

## 2016-01-03 DIAGNOSIS — M810 Age-related osteoporosis without current pathological fracture: Secondary | ICD-10-CM

## 2016-01-03 DIAGNOSIS — C50911 Malignant neoplasm of unspecified site of right female breast: Secondary | ICD-10-CM

## 2016-01-03 DIAGNOSIS — Z803 Family history of malignant neoplasm of breast: Secondary | ICD-10-CM | POA: Insufficient documentation

## 2016-01-03 NOTE — Progress Notes (Signed)
Dawn Welch  [phone] [phone]  ID: Wandra Scot OB: [birth year]  MR#: [id number]  OVA#:[id number]  Patient Care Team: Sofie Hartigan, MD as PCP - General (Family Medicine)  CHIEF COMPLAINT:  Chief Complaint  Patient presents with  . Breast Cancer    INTERVAL HISTORY: Patient returns to clinic today for routine yearly follow-up. She currently feels well and is asymptomatic. She has no neurologic complaints. She denies any recent fevers or illnesses. She has a good appetite and denies weight loss. She denies any pain. She has no chest pain or shortness of breath. She denies any nausea, vomiting, constipation, or diarrhea. She has no urinary complaints. Patient feels at her baseline and offers no specific complaints today.  REVIEW OF SYSTEMS:   Review of Systems  Constitutional: Negative.  Negative for fever, weight loss and malaise/fatigue.  Respiratory: Negative.  Negative for shortness of breath.   Cardiovascular: Negative.  Negative for chest pain.  Gastrointestinal: Negative.  Negative for blood in stool and melena.  Genitourinary: Negative.   Musculoskeletal: Negative.   Neurological: Negative.  Negative for weakness.  Psychiatric/Behavioral: Negative.     As per HPI. Otherwise, a complete review of systems is negatve.  PAST MEDICAL HISTORY: Past Medical History  Diagnosis Date  . Cancer ([place])     breast   . Depression   . Hyperlipemia   . Diabetes mellitus ([place])   . GERD (gastroesophageal reflux disease)   . Osteoporosis, post-menopausal   . Diverticulosis     PAST SURGICAL HISTORY: Past Surgical History  Procedure Laterality Date  . Breast surgery      left breast cancer  . Abdominal hysterectomy    . Cataract extraction      FAMILY HISTORY Family History  Problem Relation Age of Onset  . Cancer Father     Stomach cancer  . Breast cancer Sister        ADVANCED DIRECTIVES:    HEALTH  MAINTENANCE: Social History  Substance Use Topics  . Smoking status: Never Smoker   . Smokeless tobacco: Not on file  . Alcohol Use: No     Colonoscopy:  PAP:  Bone density:  Lipid panel:  No Known Allergies  Current Outpatient Prescriptions  Medication Sig Dispense Refill  . Calcium Carbonate-Vit D-Min (CALCIUM 1200 PO) Take by mouth.    . citalopram (CELEXA) 40 MG tablet Take 40 mg by mouth daily.    Marland Kitchen alendronate (FOSAMAX) 70 MG tablet Take 70 mg by mouth.    . pravastatin (PRAVACHOL) 10 MG tablet Take 10 mg by mouth.     No current facility-administered medications for this visit.    OBJECTIVE: Filed Vitals:   01/03/16 1019  BP: 137/84  Pulse: 65  Temp: 97.3 F (36.3 C)  Resp: 16     Body mass index is 26.26 kg/(m^2).    ECOG FS:0 - Asymptomatic  General: Well-developed, well-nourished, no acute distress. Eyes: Pink conjunctiva, anicteric sclera. Breasts: Bilateral breast and axilla without lumps or masses. Lungs: Clear to auscultation bilaterally. Heart: Regular rate and rhythm. No rubs, murmurs, or gallops. Abdomen: Soft, nontender, nondistended. No organomegaly noted, normoactive bowel sounds. Musculoskeletal: No edema, cyanosis, or clubbing. Neuro: Alert, answering all questions appropriately. Cranial nerves grossly intact. Skin: No rashes or petechiae noted. Psych: Normal affect.   LAB RESULTS:  Lab Results  Component Value Date   NA 138 08/21/2012   K 3.8 08/21/2012   CL 103 08/21/2012   CO2 26 08/21/2012  GLUCOSE 161* 08/21/2012   BUN 19* 08/21/2012   CREATININE 0.82 08/21/2012   CALCIUM 8.4* 08/21/2012   PROT 6.8 08/21/2012   ALBUMIN 3.3* 08/21/2012   AST 18 08/21/2012   ALT 22 08/21/2012   ALKPHOS 63 08/21/2012   BILITOT 0.5 08/21/2012   GFRNONAA >60 08/21/2012   GFRAA >60 08/21/2012    Lab Results  Component Value Date   WBC 4.3 08/21/2012   NEUTROABS 3.3 08/21/2012   HGB 12.4 08/21/2012   HCT 36.4 08/21/2012   MCV 95  08/21/2012   PLT 145* 08/21/2012   Lab Results  Component Value Date   LABCA2 43.4* 01/04/2015     STUDIES: No results found.  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 ER/PR positive, HER-2 3+.  PLAN:   1. Breast cancer: CA 27-29 is slightly elevated, but essentially stable. Previously, patient only received 1 infusion of chemotherapy and Herceptin in June 2009.  She refused any further treatment.  She as now completed 5 years of letrozole. Her most recent mammogram on October 2015, was reported as BI-RADS 2. Will schedule a repeat mammogram in the next 1-2 weeks. Patient will return to clinic in 1 year for laboratory work and routine evaluation.  2. Medications: Patient is not to receive any refills for Ambien or Vicodin from this clinic.  3. History of DVT: Patient stopped taking her Coumadin prior to the recommended 3 months of anticoagulation. 4. Osteoporosis:  Bone mineral density on Feb 15, 2014 continues to reveal osteoporosis, but is significantly improved from one year prior.  Patient has been instructed to continue her Fosamax, calcium, and vitamin D.  Repeat testing per primary care.  Patient expressed understanding and was in agreement with this plan. She also understands that She can call clinic at any time with any questions, concerns, or complaints.   Lloyd Huger, MD   01/03/2016 10:32 AM

## 2016-01-03 NOTE — Progress Notes (Signed)
Patient is here for 6 mlonth f/u and her last mammogram was 06/2014.

## 2016-01-04 LAB — CANCER ANTIGEN 27.29: CA 27.29: 52.2 U/mL — AB (ref 0.0–38.6)

## 2016-01-07 ENCOUNTER — Ambulatory Visit
Admission: RE | Admit: 2016-01-07 | Discharge: 2016-01-07 | Disposition: A | Discharge status: Home / Self Care | Payer: Medicare Other | Source: Ambulatory Visit | Attending: Family Medicine | Admitting: Family Medicine

## 2016-01-07 DIAGNOSIS — R928 Other abnormal and inconclusive findings on diagnostic imaging of breast: Secondary | ICD-10-CM | POA: Diagnosis not present

## 2016-01-07 DIAGNOSIS — C50911 Malignant neoplasm of unspecified site of right female breast: Secondary | ICD-10-CM

## 2016-01-07 DIAGNOSIS — Z1231 Encounter for screening mammogram for malignant neoplasm of breast: Secondary | ICD-10-CM | POA: Insufficient documentation

## 2016-01-08 ENCOUNTER — Other Ambulatory Visit: Payer: Self-pay | Admitting: Oncology

## 2016-01-08 DIAGNOSIS — R928 Other abnormal and inconclusive findings on diagnostic imaging of breast: Secondary | ICD-10-CM

## 2016-01-17 ENCOUNTER — Ambulatory Visit
Admission: RE | Admit: 2016-01-17 | Discharge: 2016-01-17 | Disposition: A | Discharge status: Home / Self Care | Payer: Medicare Other | Source: Ambulatory Visit | Attending: Oncology | Admitting: Oncology

## 2016-01-17 DIAGNOSIS — R928 Other abnormal and inconclusive findings on diagnostic imaging of breast: Secondary | ICD-10-CM

## 2016-05-10 ENCOUNTER — Ambulatory Visit
Admission: EM | Admit: 2016-05-10 | Discharge: 2016-05-10 | Disposition: A | Discharge status: Home / Self Care | Payer: Medicare Other | Attending: Family Medicine | Admitting: Family Medicine

## 2016-05-10 ENCOUNTER — Encounter: Payer: Self-pay | Admitting: Gynecology

## 2016-05-10 DIAGNOSIS — M546 Pain in thoracic spine: Secondary | ICD-10-CM | POA: Diagnosis not present

## 2016-05-10 LAB — URINALYSIS COMPLETE WITH MICROSCOPIC (ARMC ONLY)
Bacteria, UA: NONE SEEN
Bilirubin Urine: NEGATIVE
GLUCOSE, UA: NEGATIVE mg/dL
Hgb urine dipstick: NEGATIVE
KETONES UR: NEGATIVE mg/dL
LEUKOCYTES UA: NEGATIVE
NITRITE: NEGATIVE
PH: 6.5 (ref 5.0–8.0)
PROTEIN: NEGATIVE mg/dL
Specific Gravity, Urine: 1.02 (ref 1.005–1.030)

## 2016-05-10 MED ORDER — BACLOFEN 10 MG PO TABS: 10.0000 mg | ORAL_TABLET | Freq: Three times a day (TID) | ORAL | 0 refills | Status: DC | PRN

## 2016-05-10 NOTE — ED Triage Notes (Signed)
Per patient was seen at [facility] on 04/25/2016 for her back pain patient stated xray was done  which was negative and a urinalysis which shows she has an infection. Per patient was given oxycodone 5 mg #12 tablets and Keflex 500 mg x 7 days which she finish on 05/02/2016. Patient stated recurrent back pain.

## 2016-05-10 NOTE — ED Provider Notes (Signed)
[facility]    CSN: [id number] Arrival date & time: 05/10/16  [id number]  First Provider Contact:  First MD Initiated Contact with Patient 05/10/16 [phone]     History   Chief Complaint Chief Complaint  Patient presents with  . Back Pain   HPI 75 year old female with a history of osteoporosis presents with complaints of back pain.  This started approximately 2 weeks ago. She was seen in the emergency department at [facility] on 7/29. X-ray unremarkable. She was treated with oxycodone.  Patient states that she's had little improvement with oxycodone and over-the-counter pain relievers. She continues to have right lower thoracic pain/right lower rib pain. Patient states that her pain is severe. Worse pain she's ever experienced. No known exacerbating or relieving factors. No recent fall, trauma, injury. No reports of shortness of breath. No cough. Nonsmoker. No other complaints at this time.  Past Medical History:  Diagnosis Date  . Cancer [facility]) 2009   breast  left/ one chemo treatment  . Depression   . Diabetes mellitus ([place])   . Diverticulosis   . GERD (gastroesophageal reflux disease)   . Hyperlipemia   . Osteoporosis, post-menopausal    Past Surgical History:  Procedure Laterality Date  . ABDOMINAL HYSTERECTOMY    . BREAST SURGERY     left breast cancer  . CATARACT EXTRACTION     OB History    No data available     Home Medications    Prior to Admission medications   Medication Sig Start Date End Date Taking? Authorizing Provider  alendronate (FOSAMAX) 70 MG tablet Take 70 mg by mouth.   Yes Historical Provider, MD  Calcium Carbonate-Vit D-Min (CALCIUM 1200 PO) Take by mouth.   Yes Historical Provider, MD  citalopram (CELEXA) 40 MG tablet Take 40 mg by mouth daily.   Yes Historical Provider, MD  pravastatin (PRAVACHOL) 10 MG tablet Take 10 mg by mouth.   Yes Historical Provider, MD  baclofen (LIORESAL) 10 MG tablet Take 1 tablet (10 mg total) by mouth 3 (three) times  daily as needed for muscle spasms. 05/10/16   Coral Spikes, DO   Family History Family History  Problem Relation Age of Onset  . Cancer Father     Stomach cancer  . Breast cancer Sister    Social History Social History  Substance Use Topics  . Smoking status: Never Smoker  . Smokeless tobacco: Never Used  . Alcohol use No   Allergies   Review of patient's allergies indicates no known allergies.  Review of Systems Review of Systems  Constitutional: Negative.   Respiratory: Negative.   Musculoskeletal: Positive for back pain.   Physical Exam Triage Vital Signs ED Triage Vitals  Enc Vitals Group     BP 05/10/16 0852 (!) 143/60     Pulse Rate 05/10/16 0852 71     Resp 05/10/16 0852 16     Temp 05/10/16 0852 98.9 F (37.2 C)     Temp Source 05/10/16 0852 Oral     SpO2 05/10/16 0852 100 %     Weight 05/10/16 0856 152 lb (68.9 kg)     Height 05/10/16 0856 5' (1.524 m)     Head Circumference --      Peak Flow --      Pain Score 05/10/16 0855 9     Pain Loc --      Pain Edu? --      Excl. in [place]? --    No data found.  Updated Vital Signs BP (!) 143/60 (BP Location: Left Arm)   Pulse 71   Temp 98.9 F (37.2 C) (Oral)   Resp 16   Ht 5' (1.524 m)   Wt 152 lb (68.9 kg)   SpO2 100%   BMI 29.69 kg/m   Physical Exam  Constitutional: She is oriented to person, place, and time. She appears well-developed. No distress.  Cardiovascular: Normal rate and regular rhythm.   Murmur heard. Pulmonary/Chest: Effort normal.  Coarse breath sounds throughout.  Musculoskeletal:  Patient with tenderness of the right lower ribs posteriorly.  Neurological: She is alert and oriented to person, place, and time.  Vitals reviewed.  UC Treatments / Results  Labs (all labs ordered are listed, but only abnormal results are displayed) Labs Reviewed  URINALYSIS COMPLETEWITH MICROSCOPIC ([place]) - Abnormal; Notable for the following:       Result Value   Squamous Epithelial / LPF 0-5  (*)    All other components within normal limits   EKG  EKG Interpretation None      Radiology No results found.  Procedures Procedures (including critical care time)  Medications Ordered in UC Medications - No data to display  Initial Impression / Assessment and Plan / UC Course  I have reviewed the triage vital signs and the nursing notes.  Pertinent labs & imaging results that were available during my care of the patient were reviewed by me and considered in my medical decision making (see chart for details).  Patient was just seen on 7/29. Her x-ray results from that visit were reviewed. No acute findings. Pain appears MSK in origin. Treating with baclofen. Stable for discharge.  Final Clinical Impressions(s) / UC Diagnoses   Final diagnoses:  Right-sided thoracic back pain   New Prescriptions New Prescriptions   BACLOFEN (LIORESAL) 10 MG TABLET    Take 1 tablet (10 mg total) by mouth 3 (three) times daily as needed for muscle spasms.     Coral Spikes, DO 05/10/16 [phone]

## 2016-05-10 NOTE — Discharge Instructions (Signed)
This is muscular in origin. Your x-rays have been negative.  Take the baclofen as prescribed.  Please follow-up closely with your primary care physician.

## 2016-06-29 ENCOUNTER — Other Ambulatory Visit: Payer: Self-pay | Admitting: Family Medicine

## 2016-06-30 ENCOUNTER — Other Ambulatory Visit: Payer: Self-pay | Admitting: Family Medicine

## 2016-06-30 DIAGNOSIS — R0789 Other chest pain: Secondary | ICD-10-CM

## 2016-07-08 ENCOUNTER — Ambulatory Visit
Admission: RE | Admit: 2016-07-08 | Discharge: 2016-07-08 | Disposition: A | Discharge status: Home / Self Care | Payer: Medicare Other | Source: Ambulatory Visit | Attending: Family Medicine | Admitting: Family Medicine

## 2016-07-08 DIAGNOSIS — E278 Other specified disorders of adrenal gland: Secondary | ICD-10-CM | POA: Diagnosis not present

## 2016-07-08 DIAGNOSIS — R0789 Other chest pain: Secondary | ICD-10-CM | POA: Diagnosis present

## 2016-07-08 DIAGNOSIS — K449 Diaphragmatic hernia without obstruction or gangrene: Secondary | ICD-10-CM | POA: Diagnosis not present

## 2016-07-08 LAB — POCT I-STAT CREATININE: Creatinine, Ser: 0.9 mg/dL (ref 0.44–1.00)

## 2016-07-08 MED ORDER — IOPAMIDOL (ISOVUE-300) INJECTION 61%
75.0000 mL | Freq: Once | INTRAVENOUS | Status: AC | PRN
  Administered 2016-07-08: 75 mL via INTRAVENOUS

## 2016-08-06 ENCOUNTER — Other Ambulatory Visit: Payer: Self-pay | Admitting: Unknown Physician Specialty

## 2016-08-06 DIAGNOSIS — G8929 Other chronic pain: Secondary | ICD-10-CM

## 2016-08-06 DIAGNOSIS — M546 Pain in thoracic spine: Principal | ICD-10-CM

## 2016-08-07 ENCOUNTER — Ambulatory Visit
Admission: RE | Admit: 2016-08-07 | Discharge: 2016-08-07 | Disposition: A | Discharge status: Home / Self Care | Payer: Medicare Other | Source: Ambulatory Visit | Attending: Unknown Physician Specialty | Admitting: Unknown Physician Specialty

## 2016-08-07 DIAGNOSIS — M5134 Other intervertebral disc degeneration, thoracic region: Secondary | ICD-10-CM | POA: Diagnosis not present

## 2016-08-07 DIAGNOSIS — K449 Diaphragmatic hernia without obstruction or gangrene: Secondary | ICD-10-CM | POA: Diagnosis not present

## 2016-08-07 DIAGNOSIS — M546 Pain in thoracic spine: Secondary | ICD-10-CM | POA: Diagnosis not present

## 2016-08-07 DIAGNOSIS — G8929 Other chronic pain: Secondary | ICD-10-CM

## 2016-08-07 DIAGNOSIS — M5124 Other intervertebral disc displacement, thoracic region: Secondary | ICD-10-CM | POA: Diagnosis not present

## 2016-08-18 ENCOUNTER — Other Ambulatory Visit: Payer: Self-pay | Admitting: Gastroenterology

## 2016-08-18 DIAGNOSIS — K449 Diaphragmatic hernia without obstruction or gangrene: Secondary | ICD-10-CM

## 2016-08-18 DIAGNOSIS — R112 Nausea with vomiting, unspecified: Secondary | ICD-10-CM

## 2016-08-18 DIAGNOSIS — R1310 Dysphagia, unspecified: Secondary | ICD-10-CM

## 2016-08-27 ENCOUNTER — Ambulatory Visit: Payer: Medicare Other | Attending: Gastroenterology

## 2016-10-11 ENCOUNTER — Emergency Department: Payer: Medicare HMO

## 2016-10-11 ENCOUNTER — Encounter: Payer: Self-pay | Admitting: Emergency Medicine

## 2016-10-11 ENCOUNTER — Emergency Department
Admission: EM | Admit: 2016-10-11 | Discharge: 2016-10-11 | Disposition: A | Discharge status: Home / Self Care | Payer: Medicare HMO | Attending: Emergency Medicine | Admitting: Emergency Medicine

## 2016-10-11 DIAGNOSIS — Z853 Personal history of malignant neoplasm of breast: Secondary | ICD-10-CM | POA: Insufficient documentation

## 2016-10-11 DIAGNOSIS — M546 Pain in thoracic spine: Secondary | ICD-10-CM | POA: Diagnosis not present

## 2016-10-11 DIAGNOSIS — G8929 Other chronic pain: Secondary | ICD-10-CM | POA: Insufficient documentation

## 2016-10-11 DIAGNOSIS — J069 Acute upper respiratory infection, unspecified: Secondary | ICD-10-CM

## 2016-10-11 DIAGNOSIS — Z79899 Other long term (current) drug therapy: Secondary | ICD-10-CM | POA: Insufficient documentation

## 2016-10-11 DIAGNOSIS — E119 Type 2 diabetes mellitus without complications: Secondary | ICD-10-CM | POA: Diagnosis not present

## 2016-10-11 DIAGNOSIS — R0981 Nasal congestion: Secondary | ICD-10-CM | POA: Diagnosis present

## 2016-10-11 MED ORDER — FLUTICASONE PROPIONATE 50 MCG/ACT NA SUSP: 2.0000 | Freq: Every day | NASAL | 0 refills | Status: AC

## 2016-10-11 NOTE — Discharge Instructions (Signed)
Follow-up with your primary care doctor if any continued problems. Begin using Flonase nasal spray 2 sprays in each side of the nose once a day. This should help with nasal congestion. You can also use saline nose spray to help with mucus. Tylenol if needed for back pain. Call your doctor for any continued pain medication regarding your back. Also keep your appointment in February to see the surgeon about your hiatal hernia.

## 2016-10-11 NOTE — ED Provider Notes (Signed)
[facility] Emergency Department Provider Note  ____________________________________________   First MD Initiated Contact with Patient 10/11/16 1302     (approximate)  I have reviewed the triage vital signs and the nursing notes.   HISTORY  Chief Complaint Back Pain and Nasal Congestion    HPI Dawn Welch is a 76 y.o. female is here today with complaint of nasal congestion. Patient also complains of right upper back pain. Patient states that she had an MRI that showed she did have back problems. She denies any recent injury. She denies any cough, fever, chills, nausea or vomiting. She denies any diarrhea. Patient states that she has no urinary symptoms nor does she have a history of kidney stones. Patient has been taking Tylenol at home without any relief of her symptoms. Currently she rates her discomfort as 10 over 10.   Past Medical History:  Diagnosis Date  . Cancer [facility]) 2009   breast  left/ one chemo treatment  . Depression   . Diabetes mellitus ([place])   . Diverticulosis   . GERD (gastroesophageal reflux disease)   . Hyperlipemia   . Osteoporosis, post-menopausal     There are no active problems to display for this patient.   Past Surgical History:  Procedure Laterality Date  . ABDOMINAL HYSTERECTOMY    . BREAST SURGERY     left breast cancer  . CATARACT EXTRACTION      Prior to Admission medications   Medication Sig Start Date End Date Taking? Authorizing Provider  alendronate (FOSAMAX) 70 MG tablet Take 70 mg by mouth.    Historical Provider, MD  baclofen (LIORESAL) 10 MG tablet Take 1 tablet (10 mg total) by mouth 3 (three) times daily as needed for muscle spasms. 05/10/16   Coral Spikes, DO  Calcium Carbonate-Vit D-Min (CALCIUM 1200 PO) Take by mouth.    Historical Provider, MD  citalopram (CELEXA) 40 MG tablet Take 40 mg by mouth daily.    Historical Provider, MD  fluticasone (FLONASE) 50 MCG/ACT nasal spray Place 2 sprays into  both nostrils daily. 10/11/16 10/11/17  Johnn Hai, PA-C  pravastatin (PRAVACHOL) 10 MG tablet Take 10 mg by mouth.    Historical Provider, MD    Allergies Patient has no known allergies.  Family History  Problem Relation Age of Onset  . Cancer Father     Stomach cancer  . Breast cancer Sister     Social History Social History  Substance Use Topics  . Smoking status: Never Smoker  . Smokeless tobacco: Never Used  . Alcohol use No    Review of Systems Constitutional: No fever/chills Eyes: No visual changes. ENT: No sore throat. Positive nasal congestion Cardiovascular: Denies chest pain. Respiratory: Denies shortness of breath. Denies cough. Gastrointestinal: No abdominal pain.  No nausea, no vomiting.  No diarrhea.  No constipation. Genitourinary: Negative for dysuria. Musculoskeletal: Positive for chronic back pain. Skin: Negative for rash. Neurological: Negative for headaches, focal weakness or numbness.  10-point ROS otherwise negative.  ____________________________________________   PHYSICAL EXAM:  VITAL SIGNS: ED Triage Vitals [10/11/16 1233]  Enc Vitals Group     BP 118/61     Pulse Rate 95     Resp 18     Temp 98.3 F (36.8 C)     Temp Source Oral     SpO2 100 %     Weight 120 lb (54.4 kg)     Height 5' (1.524 m)     Head Circumference  Peak Flow      Pain Score 10     Pain Loc      Pain Edu?      Excl. in [place]?     Constitutional: Alert and oriented. Well appearing and in no acute distress. Eyes: Conjunctivae are normal. PERRL. EOMI. Head: Atraumatic. Nose: Mild congestion/minimal clear rhinnorhea. Nasal mucosa is watery and pale without any white mucous noted. EACs are clear bilaterally. TMs are dull without erythema or injection. Mouth/Throat: Mucous membranes are moist.  Oropharynx non-erythematous. Posterior drainage present. Neck: No stridor.   Hematological/Lymphatic/Immunilogical: No cervical lymphadenopathy. Cardiovascular:  Normal rate, regular rhythm. Grossly normal heart sounds.  Good peripheral circulation. Respiratory: Normal respiratory effort.  No retractions. Lungs CTAB. Gastrointestinal: Soft and nontender. No distention. Bowel sounds are normoactive 4 quadrants. Musculoskeletal: Examination of the back there is no gross deformity noted. There is some tenderness on palpation of the right thoracic paravertebral muscles but no active muscle spasms were seen. Patient has normal gait is ambulatory in the department. Neurologic:  Normal speech and language. No gross focal neurologic deficits are appreciated. No gait instability. Skin:  Skin is warm, dry and intact. No rash noted. Psychiatric: Mood and affect are normal. Speech and behavior are normal.  ____________________________________________   LABS (all labs ordered are listed, but only abnormal results are displayed)  Labs Reviewed - No data to display  RADIOLOGY Chest x-ray per radiologist: IMPRESSION:  No acute abnormality. Moderately large hiatal hernia.   I, Johnn Hai, personally viewed and evaluated these images (plain radiographs) as part of my medical decision making, as well as reviewing the written report by the radiologist.  ____________________________________________   PROCEDURES  Procedure(s) performed: None  Procedures  Critical Care performed: No  ____________________________________________   INITIAL IMPRESSION / ASSESSMENT AND PLAN / ED COURSE  Pertinent labs & imaging results that were available during my care of the patient were reviewed by me and considered in my medical decision making (see chart for details).    Clinical Course    Patient is to follow-up with her doctor at [facility] if any continued problems. In looking through her records it appears that she called for pain medication on 10/07/16 to her PCP and was told that pain medication would not be telephoned in. Patient is wears she has a  hiatal hernia and has an appointment in February for this. Patient was given a prescription for Flonase nasal spray to help with her nasal congestion she is also encouraged to use saline nose spray to help clear mucus. Patient was ambulatory without assistance in the department and had no difficulty. Patient apparently has some chronic issues with her thoracic spine and is under a doctor's care for that as well.   ____________________________________________   FINAL CLINICAL IMPRESSION(S) / ED DIAGNOSES  Final diagnoses:  Nasal congestion  Acute upper respiratory infection  Chronic right-sided thoracic back pain      NEW MEDICATIONS STARTED DURING THIS VISIT:  Discharge Medication List as of 10/11/2016  2:41 PM    START taking these medications   Details  fluticasone (FLONASE) 50 MCG/ACT nasal spray Place 2 sprays into both nostrils daily., Starting Sun 10/11/2016, Until Mon 10/11/2017, Print         Note:  This document was prepared using Dragon voice recognition software and may include unintentional dictation errors.    Johnn Hai, PA-C 10/11/16 [place], MD 10/11/16 [phone]

## 2016-10-11 NOTE — ED Notes (Signed)
Pt offered wheelchair, denies. Pt alert and oriented X4, active, cooperative, pt in NAD. RR even and unlabored, color WNL.

## 2016-10-11 NOTE — ED Triage Notes (Signed)
Pt comes into the ED via POV c/o lower back pain and nasal congestions.  Denies any changes in urine color, odor, or frequency.  Denies h/o kidney stones.  Patient states she has a herniated disc in her back.  Patient claims she has had nasal congestion for 1 week with no cough or fever.  Patient in NAd at this time with even and unlabored respirations.  Patient able to ambulate well into the triage room with no difficulty.

## 2016-12-04 ENCOUNTER — Ambulatory Visit: Admission: RE | Admit: 2016-12-04 | Payer: Medicare HMO | Source: Ambulatory Visit | Admitting: Gastroenterology

## 2016-12-04 ENCOUNTER — Encounter: Admission: RE | Payer: Self-pay | Source: Ambulatory Visit

## 2016-12-04 SURGERY — ESOPHAGOGASTRODUODENOSCOPY (EGD) WITH PROPOFOL
Anesthesia: General

## 2016-12-31 DIAGNOSIS — C50912 Malignant neoplasm of unspecified site of left female breast: Secondary | ICD-10-CM | POA: Insufficient documentation

## 2016-12-31 NOTE — Progress Notes (Signed)
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  INTERVAL HISTORY: Patient returns to clinic today for routine yearly follow-up. She currently feels well and is asymptomatic. She has no neurologic complaints. She denies any recent fevers or illnesses. She has a good appetite and denies weight loss. She denies any pain. She has no chest pain or shortness of breath. She denies any nausea, vomiting, constipation, or diarrhea. She has no urinary complaints. Patient feels at her baseline and offers no specific complaints today.  REVIEW OF SYSTEMS:   Review of Systems  Constitutional: Negative.  Negative for fever, malaise/fatigue and weight loss.  Respiratory: Negative.  Negative for cough and shortness of breath.   Cardiovascular: Negative.  Negative for chest pain and leg swelling.  Gastrointestinal: Negative.  Negative for abdominal pain, blood in stool and melena.  Genitourinary: Negative.   Musculoskeletal: Negative.   Neurological: Negative.  Negative for sensory change and weakness.  Psychiatric/Behavioral: Negative.  The patient is not nervous/anxious.     As per HPI. Otherwise, a complete review of systems is negative.  PAST MEDICAL HISTORY: Past Medical History:  Diagnosis Date  . Cancer [facility]) 2009   breast  left/ one chemo treatment  . Depression   . Diabetes mellitus ([place])   . Diverticulosis   . GERD (gastroesophageal reflux disease)   . Hyperlipemia   . Osteoporosis, post-menopausal     PAST SURGICAL HISTORY: Past Surgical History:  Procedure Laterality Date  . ABDOMINAL HYSTERECTOMY    . BREAST SURGERY     left breast cancer  . CATARACT  EXTRACTION      FAMILY HISTORY Family History  Problem Relation Age of Onset  . Cancer Father     Stomach cancer  . Breast cancer Sister        ADVANCED DIRECTIVES:    HEALTH MAINTENANCE: Social History  Substance Use Topics  . Smoking status: Never Smoker  . Smokeless tobacco: Never Used  . Alcohol use No     Colonoscopy:  PAP:  Bone density:  Lipid panel:  No Known Allergies  Current Outpatient Prescriptions  Medication Sig Dispense Refill  . alendronate (FOSAMAX) 70 MG tablet Take 70 mg by mouth.    . baclofen (LIORESAL) 10 MG tablet Take 1 tablet (10 mg total) by mouth 3 (three) times daily as needed for muscle spasms. 30 each 0  . Calcium Carbonate-Vit D-Min (CALCIUM 1200 PO) Take by mouth.    . citalopram (CELEXA) 40 MG tablet Take 40 mg by mouth daily.    . fluticasone (FLONASE) 50 MCG/ACT nasal spray Place 2 sprays into both nostrils daily. 16 g 0  . ketorolac (TORADOL) 10 MG tablet Take 10 mg by mouth.    . Lidocaine 4 % PTCH Place onto the skin.    . pravastatin (PRAVACHOL) 10 MG tablet Take 10 mg by mouth.     No current facility-administered medications for this visit.     OBJECTIVE: Vitals:   01/01/17 1006  BP: (!) 145/77  Pulse: 78  Resp: 18  Temp: 98.3 F (36.8 C)     Body mass index is 24.13 kg/m.    ECOG FS:0 - Asymptomatic  General: Well-developed, well-nourished, no acute distress. Eyes: Pink conjunctiva,  anicteric sclera. Breasts: Bilateral breast and axilla without lumps or masses. Patient declined exam today. Lungs: Clear to auscultation bilaterally. Heart: Regular rate and rhythm. No rubs, murmurs, or gallops. Abdomen: Soft, nontender, nondistended. No organomegaly noted, normoactive bowel sounds. Musculoskeletal: No edema, cyanosis, or clubbing. Neuro: Alert, answering all questions appropriately. Cranial nerves grossly intact. Skin: No rashes or petechiae noted. Psych: Normal affect.   LAB RESULTS:  Lab Results  Component  Value Date   NA 138 08/21/2012   K 3.8 08/21/2012   CL 103 08/21/2012   CO2 26 08/21/2012   GLUCOSE 161 (H) 08/21/2012   BUN 19 (H) 08/21/2012   CREATININE 0.90 07/08/2016   CALCIUM 8.4 (L) 08/21/2012   PROT 6.8 08/21/2012   ALBUMIN 3.3 (L) 08/21/2012   AST 18 08/21/2012   ALT 22 08/21/2012   ALKPHOS 63 08/21/2012   BILITOT 0.5 08/21/2012   GFRNONAA >60 08/21/2012   GFRAA >60 08/21/2012    Lab Results  Component Value Date   WBC 4.3 08/21/2012   NEUTROABS 3.3 08/21/2012   HGB 12.4 08/21/2012   HCT 36.4 08/21/2012   MCV 95 08/21/2012   PLT 145 (L) 08/21/2012   Lab Results  Component Value Date   LABCA2 38.5 01/01/2017     STUDIES: No results found.  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  PLAN:   1. 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 Previously, patient only received 1 infusion of chemotherapy and Herceptin in June 2009.  She refused any further treatment.  She as now completed 5 years of letrozole. Her most recent mammogram on January 17, 2016 was reported as BI-RADS 2. Will schedule a repeat mammogram in the next 1-2 weeks. Patient will return to clinic in 1 year for laboratory work and routine evaluation. At which point patient will clearly be 10 years removed from treatment and likely can be discharged from clinic. 2. Medications: Patient is not to receive any refills for Ambien or Vicodin from this clinic.  3. History of DVT: Patient stopped taking her Coumadin prior to the recommended 3 months of anticoagulation. 4. Osteoporosis:  Bone mineral density on Feb 15, 2014 continues to reveal osteoporosis, but is significantly improved from one year prior.  Patient has been instructed to continue her Fosamax, calcium, and vitamin D.  Continue  monitoring evaluation per primary care.   Patient expressed understanding and was in agreement with this plan. She also understands that She can call clinic at any time with any questions, concerns, or complaints.   Lloyd Huger, MD   01/07/2017 7:40 PM

## 2017-01-01 ENCOUNTER — Inpatient Hospital Stay: Payer: Medicare HMO | Attending: Oncology

## 2017-01-01 ENCOUNTER — Inpatient Hospital Stay (HOSPITAL_BASED_OUTPATIENT_CLINIC_OR_DEPARTMENT_OTHER): Payer: Medicare HMO | Admitting: Oncology

## 2017-01-01 DIAGNOSIS — Z79899 Other long term (current) drug therapy: Secondary | ICD-10-CM | POA: Insufficient documentation

## 2017-01-01 DIAGNOSIS — E785 Hyperlipidemia, unspecified: Secondary | ICD-10-CM | POA: Diagnosis not present

## 2017-01-01 DIAGNOSIS — Z780 Asymptomatic menopausal state: Secondary | ICD-10-CM | POA: Insufficient documentation

## 2017-01-01 DIAGNOSIS — M810 Age-related osteoporosis without current pathological fracture: Secondary | ICD-10-CM | POA: Diagnosis not present

## 2017-01-01 DIAGNOSIS — Z9223 Personal history of estrogen therapy: Secondary | ICD-10-CM

## 2017-01-01 DIAGNOSIS — Z9221 Personal history of antineoplastic chemotherapy: Secondary | ICD-10-CM | POA: Insufficient documentation

## 2017-01-01 DIAGNOSIS — C50912 Malignant neoplasm of unspecified site of left female breast: Secondary | ICD-10-CM

## 2017-01-01 DIAGNOSIS — Z86718 Personal history of other venous thrombosis and embolism: Secondary | ICD-10-CM | POA: Insufficient documentation

## 2017-01-01 DIAGNOSIS — Z800 Family history of malignant neoplasm of digestive organs: Secondary | ICD-10-CM | POA: Insufficient documentation

## 2017-01-01 DIAGNOSIS — Z853 Personal history of malignant neoplasm of breast: Secondary | ICD-10-CM | POA: Diagnosis present

## 2017-01-01 DIAGNOSIS — Z803 Family history of malignant neoplasm of breast: Secondary | ICD-10-CM

## 2017-01-01 DIAGNOSIS — Z170 Estrogen receptor positive status [ER+]: Secondary | ICD-10-CM | POA: Diagnosis not present

## 2017-01-01 DIAGNOSIS — E119 Type 2 diabetes mellitus without complications: Secondary | ICD-10-CM

## 2017-01-01 DIAGNOSIS — K219 Gastro-esophageal reflux disease without esophagitis: Secondary | ICD-10-CM

## 2017-01-01 DIAGNOSIS — C50911 Malignant neoplasm of unspecified site of right female breast: Secondary | ICD-10-CM

## 2017-01-02 LAB — CANCER ANTIGEN 27.29: CA 27.29: 38.5 U/mL (ref 0.0–38.6)

## 2017-01-05 ENCOUNTER — Other Ambulatory Visit: Payer: Self-pay

## 2017-01-05 DIAGNOSIS — Z170 Estrogen receptor positive status [ER+]: Principal | ICD-10-CM

## 2017-01-05 DIAGNOSIS — C50912 Malignant neoplasm of unspecified site of left female breast: Secondary | ICD-10-CM

## 2017-01-12 ENCOUNTER — Ambulatory Visit: Admission: RE | Admit: 2017-01-12 | Payer: Medicare HMO | Source: Ambulatory Visit

## 2017-01-28 ENCOUNTER — Ambulatory Visit
Admission: RE | Admit: 2017-01-28 | Discharge: 2017-01-28 | Disposition: A | Discharge status: Home / Self Care | Payer: Medicare HMO | Source: Ambulatory Visit | Attending: Hematology and Oncology | Admitting: Hematology and Oncology

## 2017-01-28 DIAGNOSIS — Z1231 Encounter for screening mammogram for malignant neoplasm of breast: Secondary | ICD-10-CM | POA: Diagnosis present

## 2017-01-28 DIAGNOSIS — C50912 Malignant neoplasm of unspecified site of left female breast: Secondary | ICD-10-CM

## 2017-01-28 DIAGNOSIS — Z170 Estrogen receptor positive status [ER+]: Secondary | ICD-10-CM

## 2017-01-28 HISTORY — DX: Personal history of antineoplastic chemotherapy: Z92.21

## 2017-01-28 HISTORY — DX: Malignant neoplasm of unspecified site of unspecified female breast: C50.919

## 2017-02-08 ENCOUNTER — Ambulatory Visit: Payer: Medicare HMO | Admitting: Oncology

## 2017-02-14 NOTE — Progress Notes (Signed)
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  INTERVAL HISTORY: Patient is referred back to clinic today for mild weight loss and increasing back pain. Her performance status has declined. She has no neurologic complaints. She denies any recent fevers or illnesses. She has a poor appetite. She denies any other pain. She has no chest pain or shortness of breath. She denies any nausea, vomiting, constipation, or diarrhea. She has no urinary complaints. Patient offers no further specific complaints today.  REVIEW OF SYSTEMS:   Review of Systems  Constitutional: Positive for malaise/fatigue and weight loss. Negative for fever.  Respiratory: Negative.  Negative for cough and shortness of breath.   Cardiovascular: Negative.  Negative for chest pain and leg swelling.  Gastrointestinal: Negative.  Negative for abdominal pain, blood in stool and melena.  Genitourinary: Negative.   Musculoskeletal: Positive for back pain.  Skin: Negative.  Negative for rash.  Neurological: Positive for weakness. Negative for sensory change.  Psychiatric/Behavioral: Positive for depression. The patient is not nervous/anxious.     As per HPI. Otherwise, a complete review of systems is negative.  PAST MEDICAL HISTORY: Past Medical History:  Diagnosis Date  . Breast cancer ([place]) 2009   left breast lumpectomy/chemo  . Cancer [facility]) 2009   breast  left/ one chemo treatment  . Depression   . Diabetes mellitus ([place])   . Diverticulosis   . GERD (gastroesophageal reflux disease)   . Hyperlipemia   . Osteoporosis, post-menopausal   . Personal history of chemotherapy     PAST SURGICAL  HISTORY: Past Surgical History:  Procedure Laterality Date  . ABDOMINAL HYSTERECTOMY    . BREAST LUMPECTOMY Left 2009   positive/chemo  . BREAST SURGERY     left breast cancer  . CATARACT EXTRACTION      FAMILY HISTORY Family History  Problem Relation Age of Onset  . Cancer Father        Stomach cancer  . Breast cancer Sister        ADVANCED DIRECTIVES:    HEALTH MAINTENANCE: Social History  Substance Use Topics  . Smoking status: Never Smoker  . Smokeless tobacco: Never Used  . Alcohol use No     Colonoscopy:  PAP:  Bone density:  Lipid panel:  No Known Allergies  Current Outpatient Prescriptions  Medication Sig Dispense Refill  . alendronate (FOSAMAX) 70 MG tablet Take 70 mg by mouth.    . baclofen (LIORESAL) 10 MG tablet Take 1 tablet (10 mg total) by mouth 3 (three) times daily as needed for muscle spasms. 30 each 0  . Calcium Carbonate-Vit D-Min (CALCIUM 1200 PO) Take by mouth.    . citalopram (CELEXA) 40 MG tablet Take 40 mg by mouth daily.    . fluticasone (FLONASE) 50 MCG/ACT nasal spray Place 2 sprays into both nostrils daily. 16 g 0  . ketorolac (TORADOL) 10 MG tablet Take 10 mg by mouth.    . pravastatin (PRAVACHOL) 10 MG tablet Take 10 mg by mouth.     No current facility-administered medications for this visit.     OBJECTIVE: Vitals:   02/15/17 0946  BP: 131/72  Pulse: 66  Resp: 20  Temp: 97.8 F (36.6 C)  Body mass index is 23.47 kg/m.    ECOG FS:0 - Asymptomatic  General: Well-developed, well-nourished, no acute distress. Eyes: Pink conjunctiva, anicteric sclera. Breasts: Bilateral breast and axilla without lumps or masses. Patient declined exam today. Lungs: Clear to auscultation bilaterally. Heart: Regular rate and rhythm. No rubs, murmurs, or gallops. Abdomen: Soft, nontender, nondistended. No organomegaly noted, normoactive bowel sounds. Musculoskeletal: No edema, cyanosis, or clubbing. No point tenderness and thoracic  spine. Neuro: Alert, answering all questions appropriately. Cranial nerves grossly intact. Skin: No rashes or petechiae noted. Psych: Flat affect.   LAB RESULTS:  Lab Results  Component Value Date   NA 138 08/21/2012   K 3.8 08/21/2012   CL 103 08/21/2012   CO2 26 08/21/2012   GLUCOSE 161 (H) 08/21/2012   BUN 19 (H) 08/21/2012   CREATININE 0.90 07/08/2016   CALCIUM 8.4 (L) 08/21/2012   PROT 6.8 08/21/2012   ALBUMIN 3.3 (L) 08/21/2012   AST 18 08/21/2012   ALT 22 08/21/2012   ALKPHOS 63 08/21/2012   BILITOT 0.5 08/21/2012   GFRNONAA >60 08/21/2012   GFRAA >60 08/21/2012    Lab Results  Component Value Date   WBC 4.3 08/21/2012   NEUTROABS 3.3 08/21/2012   HGB 12.4 08/21/2012   HCT 36.4 08/21/2012   MCV 95 08/21/2012   PLT 145 (L) 08/21/2012   Lab Results  Component Value Date   LABCA2 38.5 01/01/2017     STUDIES: Mm Digital Screening  Result Date: 01/28/2017 CLINICAL DATA:  Screening. EXAM: DIGITAL SCREENING BILATERAL MAMMOGRAM WITH CAD COMPARISON:  Previous exam(s). ACR Breast Density Category b: There are scattered areas of fibroglandular density. FINDINGS: There are no findings suspicious for malignancy. Images were processed with CAD. IMPRESSION: No mammographic evidence of malignancy. A result letter of this screening mammogram will be mailed directly to the patient. RECOMMENDATION: Screening mammogram in one year. (Code:SM-B-01Y) BI-RADS CATEGORY  1: Negative. Electronically Signed   By: Ammie Ferrier M.D.   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  PLAN:   1. 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 Previously, patient only received 1 infusion of chemotherapy and Herceptin in June 2009.  She refused any further  treatment.  She as now completed 5 years of letrozole. Her most recent mammogram on Jan 28, 2017 was reported as BI-RADS 1. Patient has been instructed to keep her previously scheduled follow-up appointment in approximately one year. At which time she will be greater than 10 years removed from treatment and likely can be discharged from clinic. 2. Medications: Patient is not to receive any refills for Ambien or Vicodin from this clinic.  3. History of DVT: Patient stopped taking her Coumadin prior to the recommended 3 months of anticoagulation. 4. Osteoporosis:  Bone mineral density on Feb 15, 2014 continues to reveal osteoporosis, but is significantly improved from one year prior.  Patient has been instructed to continue her Fosamax, calcium, and vitamin D.  Continue monitoring evaluation per primary care.  5. Back pain: Unclear etiology. Patient had a thoracic MRI in November 2017 which revealed significant arthritis. Will repeat MRI in the next 1-2 weeks. Will also get CA-27-29 for completeness. 6. Weight loss: Mild, likely secondary to poor appetite. Monitor.  Approximately 30 minutes was spent in discussion of which greater than 50% was consultation.  Patient expressed understanding and was in agreement with this plan. She also understands that She can call clinic at any time with any questions, concerns, or complaints.   Lloyd Huger, MD   02/15/2017 10:04 AM

## 2017-02-15 ENCOUNTER — Inpatient Hospital Stay: Payer: Medicare HMO | Attending: Oncology | Admitting: Oncology

## 2017-02-15 ENCOUNTER — Inpatient Hospital Stay: Payer: Medicare HMO

## 2017-02-15 VITALS — BP 131/72 | HR 66 | Temp 97.8°F | Resp 20 | Wt 120.2 lb

## 2017-02-15 DIAGNOSIS — M549 Dorsalgia, unspecified: Secondary | ICD-10-CM | POA: Insufficient documentation

## 2017-02-15 DIAGNOSIS — Z9221 Personal history of antineoplastic chemotherapy: Secondary | ICD-10-CM | POA: Diagnosis not present

## 2017-02-15 DIAGNOSIS — Z79899 Other long term (current) drug therapy: Secondary | ICD-10-CM | POA: Diagnosis not present

## 2017-02-15 DIAGNOSIS — G8929 Other chronic pain: Secondary | ICD-10-CM

## 2017-02-15 DIAGNOSIS — C50912 Malignant neoplasm of unspecified site of left female breast: Secondary | ICD-10-CM

## 2017-02-15 DIAGNOSIS — Z853 Personal history of malignant neoplasm of breast: Secondary | ICD-10-CM

## 2017-02-15 DIAGNOSIS — Z800 Family history of malignant neoplasm of digestive organs: Secondary | ICD-10-CM

## 2017-02-15 DIAGNOSIS — Z86718 Personal history of other venous thrombosis and embolism: Secondary | ICD-10-CM | POA: Diagnosis not present

## 2017-02-15 DIAGNOSIS — M810 Age-related osteoporosis without current pathological fracture: Secondary | ICD-10-CM

## 2017-02-15 DIAGNOSIS — Z170 Estrogen receptor positive status [ER+]: Secondary | ICD-10-CM | POA: Insufficient documentation

## 2017-02-15 DIAGNOSIS — R634 Abnormal weight loss: Secondary | ICD-10-CM

## 2017-02-15 DIAGNOSIS — Z803 Family history of malignant neoplasm of breast: Secondary | ICD-10-CM | POA: Diagnosis not present

## 2017-02-15 DIAGNOSIS — Z7901 Long term (current) use of anticoagulants: Secondary | ICD-10-CM | POA: Insufficient documentation

## 2017-02-15 DIAGNOSIS — Z9223 Personal history of estrogen therapy: Secondary | ICD-10-CM

## 2017-02-15 DIAGNOSIS — R630 Anorexia: Secondary | ICD-10-CM | POA: Diagnosis not present

## 2017-02-15 DIAGNOSIS — M546 Pain in thoracic spine: Secondary | ICD-10-CM

## 2017-02-15 NOTE — Progress Notes (Signed)
Patient referred back by PCP, reports weight loss and back pain.

## 2017-02-16 LAB — CANCER ANTIGEN 27.29: CA 27.29: 29.2 U/mL (ref 0.0–38.6)

## 2017-02-25 ENCOUNTER — Ambulatory Visit
Admission: RE | Admit: 2017-02-25 | Discharge: 2017-02-25 | Disposition: A | Discharge status: Home / Self Care | Payer: Medicare HMO | Source: Ambulatory Visit | Attending: Oncology | Admitting: Oncology

## 2017-02-25 DIAGNOSIS — M546 Pain in thoracic spine: Secondary | ICD-10-CM | POA: Insufficient documentation

## 2017-02-25 DIAGNOSIS — M47814 Spondylosis without myelopathy or radiculopathy, thoracic region: Secondary | ICD-10-CM | POA: Insufficient documentation

## 2017-02-25 DIAGNOSIS — M5124 Other intervertebral disc displacement, thoracic region: Secondary | ICD-10-CM | POA: Diagnosis not present

## 2017-02-25 DIAGNOSIS — K449 Diaphragmatic hernia without obstruction or gangrene: Secondary | ICD-10-CM | POA: Diagnosis not present

## 2017-02-25 DIAGNOSIS — G8929 Other chronic pain: Secondary | ICD-10-CM

## 2017-03-01 ENCOUNTER — Other Ambulatory Visit: Payer: Self-pay

## 2017-03-01 DIAGNOSIS — C50912 Malignant neoplasm of unspecified site of left female breast: Secondary | ICD-10-CM

## 2017-03-01 DIAGNOSIS — Z170 Estrogen receptor positive status [ER+]: Principal | ICD-10-CM

## 2017-03-11 ENCOUNTER — Telehealth: Payer: Self-pay | Admitting: *Deleted

## 2017-03-11 NOTE — Telephone Encounter (Signed)
-----   Message from Luretha Murphy, [place] sent at 03/11/2017  3:02 PM EDT ----- Regarding: FW: mri results Contact: [phone]   ----- Message ----- From: Secundino Ginger Sent: 03/11/2017   1:57 PM To: Luretha Murphy, CMA Subject: mri results                                    Particia Lather, Dawn Welch's son called for results of her MRI that DR Woodfin Ganja ordered.

## 2017-03-11 NOTE — Telephone Encounter (Signed)
Results given to patients son, copy of MRI report will be mailed to patient per request.

## 2017-03-22 ENCOUNTER — Telehealth: Payer: Self-pay | Admitting: *Deleted

## 2017-03-22 NOTE — Telephone Encounter (Signed)
Called to request results of MRI done 02/25/17 Please return call [phone]  IMPRESSION: No change since the study of 08/07/2016. 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 Ordinary age related to facet osteoarthritis and ligamentous hypertrophy. No compressive stenosis of the central canal or neural foramina. The findings could certainly be associated with back pain in general. No evidence of metastatic disease.

## 2017-03-22 NOTE — Telephone Encounter (Signed)
I thought we already gave family the results and referred the patient either back to primary care orthopedics. No evidence of malignancy.

## 2017-03-22 NOTE — Telephone Encounter (Signed)
I do not have the son on her list for [employer]IP[employer] and this was called and she has seenDr Chasnis since then and a copy was mailed to the home of the patient

## 2017-03-22 NOTE — Telephone Encounter (Signed)
Yes I spoke to the patient daughter

## 2017-10-14 ENCOUNTER — Other Ambulatory Visit: Payer: Self-pay | Admitting: Family Medicine

## 2017-10-14 DIAGNOSIS — E278 Other specified disorders of adrenal gland: Secondary | ICD-10-CM

## 2017-10-14 DIAGNOSIS — R109 Unspecified abdominal pain: Secondary | ICD-10-CM

## 2017-10-25 ENCOUNTER — Ambulatory Visit: Payer: Medicare HMO

## 2017-11-01 ENCOUNTER — Ambulatory Visit
Admission: RE | Admit: 2017-11-01 | Discharge: 2017-11-01 | Disposition: A | Discharge status: Home / Self Care | Payer: Medicare HMO | Source: Ambulatory Visit | Attending: Family Medicine | Admitting: Family Medicine

## 2017-11-01 DIAGNOSIS — K449 Diaphragmatic hernia without obstruction or gangrene: Secondary | ICD-10-CM | POA: Insufficient documentation

## 2017-11-01 DIAGNOSIS — R109 Unspecified abdominal pain: Secondary | ICD-10-CM

## 2017-11-01 DIAGNOSIS — D3501 Benign neoplasm of right adrenal gland: Secondary | ICD-10-CM | POA: Diagnosis not present

## 2017-11-01 DIAGNOSIS — I700 Atherosclerosis of aorta: Secondary | ICD-10-CM | POA: Diagnosis not present

## 2017-11-01 DIAGNOSIS — E278 Other specified disorders of adrenal gland: Secondary | ICD-10-CM

## 2018-01-31 ENCOUNTER — Inpatient Hospital Stay: Admission: RE | Admit: 2018-01-31 | Payer: Medicare HMO | Source: Ambulatory Visit

## 2018-01-31 NOTE — Progress Notes (Deleted)
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  INTERVAL HISTORY: Patient is referred back to clinic today for mild weight loss and increasing back pain. Her performance status has declined. She has no neurologic complaints. She denies any recent fevers or illnesses. She has a poor appetite. She denies any other pain. She has no chest pain or shortness of breath. She denies any nausea, vomiting, constipation, or diarrhea. She has no urinary complaints. Patient offers no further specific complaints today.  REVIEW OF SYSTEMS:   Review of Systems  Constitutional: Positive for malaise/fatigue and weight loss. Negative for fever.  Respiratory: Negative.  Negative for cough and shortness of breath.   Cardiovascular: Negative.  Negative for chest pain and leg swelling.  Gastrointestinal: Negative.  Negative for abdominal pain, blood in stool and melena.  Genitourinary: Negative.   Musculoskeletal: Positive for back pain.  Skin: Negative.  Negative for rash.  Neurological: Positive for weakness. Negative for sensory change.  Psychiatric/Behavioral: Positive for depression. The patient is not nervous/anxious.     As per HPI. Otherwise, a complete review of systems is negative.  PAST MEDICAL HISTORY: Past Medical History:  Diagnosis Date  . Breast cancer ([place]) 2009   left breast lumpectomy/chemo  . Cancer [facility]) 2009   breast  left/ one chemo treatment  . Depression   . Diabetes mellitus ([place])   . Diverticulosis   . GERD (gastroesophageal reflux disease)   . Hyperlipemia   . Osteoporosis, post-menopausal   . Personal history of chemotherapy     PAST SURGICAL  HISTORY: Past Surgical History:  Procedure Laterality Date  . ABDOMINAL HYSTERECTOMY    . BREAST LUMPECTOMY Left 2009   positive/chemo  . BREAST SURGERY     left breast cancer  . CATARACT EXTRACTION      FAMILY HISTORY Family History  Problem Relation Age of Onset  . Cancer Father        Stomach cancer  . Breast cancer Sister        ADVANCED DIRECTIVES:    HEALTH MAINTENANCE: Social History   Tobacco Use  . Smoking status: Never Smoker  . Smokeless tobacco: Never Used  Substance Use Topics  . Alcohol use: No  . Drug use: No     Colonoscopy:  PAP:  Bone density:  Lipid panel:  No Known Allergies  Current Outpatient Medications  Medication Sig Dispense Refill  . alendronate (FOSAMAX) 70 MG tablet Take 70 mg by mouth.    . baclofen (LIORESAL) 10 MG tablet Take 1 tablet (10 mg total) by mouth 3 (three) times daily as needed for muscle spasms. 30 each 0  . Calcium Carbonate-Vit D-Min (CALCIUM 1200 PO) Take by mouth.    . citalopram (CELEXA) 40 MG tablet Take 40 mg by mouth daily.    . fluticasone (FLONASE) 50 MCG/ACT nasal spray Place 2 sprays into both nostrils daily. 16 g 0  . ketorolac (TORADOL) 10 MG tablet Take 10 mg by mouth.    . pravastatin (PRAVACHOL) 10 MG tablet Take 10 mg by mouth.     No current facility-administered medications for this visit.     OBJECTIVE: There were no vitals filed for this visit.   There is no height or  weight on file to calculate BMI.    ECOG FS:0 - Asymptomatic  General: Well-developed, well-nourished, no acute distress. Eyes: Pink conjunctiva, anicteric sclera. Breasts: Bilateral breast and axilla without lumps or masses. Patient declined exam today. Lungs: Clear to auscultation bilaterally. Heart: Regular rate and rhythm. No rubs, murmurs, or gallops. Abdomen: Soft, nontender, nondistended. No organomegaly noted, normoactive bowel sounds. Musculoskeletal: No edema, cyanosis, or clubbing. No point tenderness and thoracic  spine. Neuro: Alert, answering all questions appropriately. Cranial nerves grossly intact. Skin: No rashes or petechiae noted. Psych: Flat affect.   LAB RESULTS:  Lab Results  Component Value Date   NA 138 08/21/2012   K 3.8 08/21/2012   CL 103 08/21/2012   CO2 26 08/21/2012   GLUCOSE 161 (H) 08/21/2012   BUN 19 (H) 08/21/2012   CREATININE 0.90 07/08/2016   CALCIUM 8.4 (L) 08/21/2012   PROT 6.8 08/21/2012   ALBUMIN 3.3 (L) 08/21/2012   AST 18 08/21/2012   ALT 22 08/21/2012   ALKPHOS 63 08/21/2012   BILITOT 0.5 08/21/2012   GFRNONAA >60 08/21/2012   GFRAA >60 08/21/2012    Lab Results  Component Value Date   WBC 4.3 08/21/2012   NEUTROABS 3.3 08/21/2012   HGB 12.4 08/21/2012   HCT 36.4 08/21/2012   MCV 95 08/21/2012   PLT 145 (L) 08/21/2012   Lab Results  Component Value Date   LABCA2 29.2 02/15/2017     STUDIES: No results found.  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  PLAN:   1. 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 Previously, patient only received 1 infusion of chemotherapy and Herceptin in June 2009.  She refused any further treatment.  She as now completed 5 years of letrozole. Her most recent mammogram on Jan 28, 2017 was reported as BI-RADS 1. Patient has been instructed to keep her previously scheduled follow-up appointment in approximately one year. At which time she will be greater than 10 years removed from treatment and likely can be discharged from clinic. 2. Medications: Patient is not to receive any refills for Ambien or Vicodin from this clinic.  3. History of DVT: Patient stopped taking her Coumadin prior to the recommended 3 months of anticoagulation. 4. Osteoporosis:  Bone mineral density on Feb 15, 2014 continues to  reveal osteoporosis, but is significantly improved from one year prior.  Patient has been instructed to continue her Fosamax, calcium, and vitamin D.  Continue monitoring evaluation per primary care.  5. Back pain: Unclear etiology. Patient had a thoracic MRI in November 2017 which revealed significant arthritis. Will repeat MRI in the next 1-2 weeks. Will also get CA-27-29 for completeness. 6. Weight loss: Mild, likely secondary to poor appetite. Monitor.  Approximately 30 minutes was spent in discussion of which greater than 50% was consultation.  Patient expressed understanding and was in agreement with this plan. She also understands that She can call clinic at any time with any questions, concerns, or complaints.   Lloyd Huger, MD   01/31/2018 11:57 PM

## 2018-02-04 ENCOUNTER — Inpatient Hospital Stay: Payer: PRIVATE HEALTH INSURANCE | Admitting: Oncology

## 2018-02-08 NOTE — Progress Notes (Signed)
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  INTERVAL HISTORY: Patient returns to clinic today for routine yearly evaluation.  She currently feels well and is asymptomatic. She has no neurologic complaints. She denies any recent fevers or illnesses. She has a fair appetite, but denies any weight loss. She has no chest pain or shortness of breath. She denies any nausea, vomiting, constipation, or diarrhea. She has no urinary complaints.  Patient offers no specific complaints today.  REVIEW OF SYSTEMS:   Review of Systems  Constitutional: Negative.  Negative for fever, malaise/fatigue and weight loss.  Respiratory: Negative.  Negative for cough and shortness of breath.   Cardiovascular: Negative.  Negative for chest pain and leg swelling.  Gastrointestinal: Negative.  Negative for abdominal pain, blood in stool and melena.  Genitourinary: Negative.   Musculoskeletal: Negative.  Negative for back pain.  Skin: Negative.  Negative for rash.  Neurological: Negative for sensory change, focal weakness and weakness.  Psychiatric/Behavioral: Positive for depression. The patient is not nervous/anxious.     As per HPI. Otherwise, a complete review of systems is negative.  PAST MEDICAL HISTORY: Past Medical History:  Diagnosis Date  . Breast cancer ([place]) 2009   left breast lumpectomy/chemo  . Cancer [facility]) 2009   breast  left/ one chemo treatment  . Depression   . Diabetes mellitus ([place])   . Diverticulosis   . GERD (gastroesophageal reflux disease)   . Hyperlipemia   . Osteoporosis, post-menopausal   . Personal history of chemotherapy     PAST SURGICAL  HISTORY: Past Surgical History:  Procedure Laterality Date  . ABDOMINAL HYSTERECTOMY    . BREAST LUMPECTOMY Left 2009   positive/chemo  . BREAST SURGERY     left breast cancer  . CATARACT EXTRACTION      FAMILY HISTORY Family History  Problem Relation Age of Onset  . Cancer Father        Stomach cancer  . Breast cancer Sister        ADVANCED DIRECTIVES:    HEALTH MAINTENANCE: Social History   Tobacco Use  . Smoking status: Never Smoker  . Smokeless tobacco: Never Used  Substance Use Topics  . Alcohol use: No  . Drug use: No     Colonoscopy:  PAP:  Bone density:  Lipid panel:  Allergies  Allergen Reactions  . Oxycodone Itching  . Tramadol Itching    Benadryl helps a little bit    Current Outpatient Medications  Medication Sig Dispense Refill  . alendronate (FOSAMAX) 70 MG tablet Take 70 mg by mouth once a week.     Marland Kitchen aspirin EC 325 MG tablet Take by mouth.    . baclofen (LIORESAL) 10 MG tablet Take 1 tablet (10 mg total) by mouth 3 (three) times daily as needed for muscle spasms. 30 each 0  . busPIRone (BUSPAR) 7.5 MG tablet TAKE 1 TABLET BY MOUTH TWICE DAILY    . Calcium Carbonate-Vit D-Min (CALCIUM 1200 PO) Take by mouth.    . citalopram (CELEXA) 40 MG tablet Take 40 mg by mouth daily.    Marland Kitchen gabapentin (NEURONTIN) 100 MG capsule Take 100 mg by mouth at bedtime.   5  . pravastatin (PRAVACHOL) 10 MG tablet  Take 10 mg by mouth.    . fluticasone (FLONASE) 50 MCG/ACT nasal spray Place 2 sprays into both nostrils daily. 16 g 0  . ketorolac (TORADOL) 10 MG tablet Take 10 mg by mouth.     No current facility-administered medications for this visit.     OBJECTIVE: Vitals:   02/11/18 1103  BP: 136/66  Pulse: 92  Resp: 18  Temp: 97.6 F (36.4 C)     Body mass index is 23.16 kg/m.    ECOG FS:0 - Asymptomatic  General: Well-developed, well-nourished, no acute distress. Eyes: Pink conjunctiva, anicteric sclera. Breast: Patient declined breast exam  today. Lungs: Clear to auscultation bilaterally. Heart: Regular rate and rhythm. No rubs, murmurs, or gallops. Abdomen: Soft, nontender, nondistended. No organomegaly noted, normoactive bowel sounds. Musculoskeletal: No edema, cyanosis, or clubbing. Neuro: Alert, answering all questions appropriately. Cranial nerves grossly intact. Skin: No rashes or petechiae noted. Psych: Flat affect.   LAB RESULTS:  Lab Results  Component Value Date   NA 138 08/21/2012   K 3.8 08/21/2012   CL 103 08/21/2012   CO2 26 08/21/2012   GLUCOSE 161 (H) 08/21/2012   BUN 19 (H) 08/21/2012   CREATININE 0.90 07/08/2016   CALCIUM 8.4 (L) 08/21/2012   PROT 6.8 08/21/2012   ALBUMIN 3.3 (L) 08/21/2012   AST 18 08/21/2012   ALT 22 08/21/2012   ALKPHOS 63 08/21/2012   BILITOT 0.5 08/21/2012   GFRNONAA >60 08/21/2012   GFRAA >60 08/21/2012    Lab Results  Component Value Date   WBC 4.3 08/21/2012   NEUTROABS 3.3 08/21/2012   HGB 12.4 08/21/2012   HCT 36.4 08/21/2012   MCV 95 08/21/2012   PLT 145 (L) 08/21/2012   Lab Results  Component Value Date   LABCA2 29.2 02/15/2017     STUDIES: No results found.  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  PLAN:   1. 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 Previously, patient only received 1 infusion of chemotherapy and Herceptin in June 2009.  She refused any further treatment.  She as now completed 5 years of letrozole. Her most recent mammogram on Jan 28, 2017 was reported as BI-RADS 1.  Patient is now greater than 10 years removed from completing her treatments and can be discharged from clinic.  Have ordered a mammogram to be scheduled in the next several weeks, but all subsequent mammograms can be ordered by her primary care  physician.  Please refer patient back if there are any questions or concerns.   2. Medications: Patient is not to receive any refills for Ambien or Vicodin from this clinic.  3. History of DVT: Patient stopped taking her Coumadin prior to the recommended 3 months of anticoagulation. 4. Osteoporosis:  Bone mineral density on Feb 15, 2014 revealed osteoporosis.  Unclear of patient's compliance with Fosamax, calcium, and vitamin D.  Continue monitoring evaluation per primary care.   Approximately 20 minutes was spent in discussion of which greater than 50% was consultation.  Patient expressed understanding and was in agreement with this plan. She also understands that She can call clinic at any time with any questions, concerns, or complaints.   Lloyd Huger, MD   02/12/2018 11:08 AM

## 2018-02-11 ENCOUNTER — Other Ambulatory Visit: Payer: Self-pay

## 2018-02-11 ENCOUNTER — Inpatient Hospital Stay: Payer: Medicare HMO | Attending: Oncology | Admitting: Oncology

## 2018-02-11 VITALS — BP 136/66 | HR 92 | Temp 97.6°F | Resp 18 | Wt 118.6 lb

## 2018-02-11 DIAGNOSIS — Z803 Family history of malignant neoplasm of breast: Secondary | ICD-10-CM

## 2018-02-11 DIAGNOSIS — E119 Type 2 diabetes mellitus without complications: Secondary | ICD-10-CM | POA: Diagnosis not present

## 2018-02-11 DIAGNOSIS — Z853 Personal history of malignant neoplasm of breast: Secondary | ICD-10-CM | POA: Insufficient documentation

## 2018-02-11 DIAGNOSIS — K219 Gastro-esophageal reflux disease without esophagitis: Secondary | ICD-10-CM

## 2018-02-11 DIAGNOSIS — M810 Age-related osteoporosis without current pathological fracture: Secondary | ICD-10-CM | POA: Diagnosis not present

## 2018-02-11 DIAGNOSIS — Z9221 Personal history of antineoplastic chemotherapy: Secondary | ICD-10-CM

## 2018-02-11 DIAGNOSIS — Z170 Estrogen receptor positive status [ER+]: Secondary | ICD-10-CM | POA: Diagnosis not present

## 2018-02-11 DIAGNOSIS — Z9223 Personal history of estrogen therapy: Secondary | ICD-10-CM

## 2018-02-11 DIAGNOSIS — Z79899 Other long term (current) drug therapy: Secondary | ICD-10-CM | POA: Diagnosis not present

## 2018-02-11 DIAGNOSIS — E785 Hyperlipidemia, unspecified: Secondary | ICD-10-CM | POA: Insufficient documentation

## 2018-02-11 DIAGNOSIS — Z7982 Long term (current) use of aspirin: Secondary | ICD-10-CM | POA: Diagnosis not present

## 2018-02-11 DIAGNOSIS — Z86718 Personal history of other venous thrombosis and embolism: Secondary | ICD-10-CM | POA: Insufficient documentation

## 2018-02-11 DIAGNOSIS — Z800 Family history of malignant neoplasm of digestive organs: Secondary | ICD-10-CM | POA: Diagnosis not present

## 2018-02-11 DIAGNOSIS — C50912 Malignant neoplasm of unspecified site of left female breast: Secondary | ICD-10-CM

## 2018-02-11 NOTE — Progress Notes (Signed)
Pt here for follow up .stated tell the Dr " Im doing all right today "

## 2018-03-03 ENCOUNTER — Ambulatory Visit
Admission: RE | Admit: 2018-03-03 | Discharge: 2018-03-03 | Disposition: A | Discharge status: Home / Self Care | Payer: Medicare HMO | Source: Ambulatory Visit | Attending: Oncology | Admitting: Oncology

## 2018-03-03 DIAGNOSIS — C50912 Malignant neoplasm of unspecified site of left female breast: Secondary | ICD-10-CM | POA: Diagnosis present

## 2018-03-03 DIAGNOSIS — Z1231 Encounter for screening mammogram for malignant neoplasm of breast: Secondary | ICD-10-CM | POA: Diagnosis not present

## 2018-03-03 DIAGNOSIS — Z170 Estrogen receptor positive status [ER+]: Secondary | ICD-10-CM | POA: Diagnosis present

## 2018-10-09 DIAGNOSIS — K259 Gastric ulcer, unspecified as acute or chronic, without hemorrhage or perforation: Secondary | ICD-10-CM | POA: Diagnosis not present

## 2018-10-09 DIAGNOSIS — E119 Type 2 diabetes mellitus without complications: Secondary | ICD-10-CM | POA: Diagnosis not present

## 2018-10-09 DIAGNOSIS — K219 Gastro-esophageal reflux disease without esophagitis: Secondary | ICD-10-CM | POA: Diagnosis not present

## 2018-10-09 DIAGNOSIS — M810 Age-related osteoporosis without current pathological fracture: Secondary | ICD-10-CM | POA: Diagnosis not present

## 2018-10-09 DIAGNOSIS — K922 Gastrointestinal hemorrhage, unspecified: Secondary | ICD-10-CM | POA: Diagnosis not present

## 2018-10-09 DIAGNOSIS — E785 Hyperlipidemia, unspecified: Secondary | ICD-10-CM | POA: Diagnosis not present

## 2018-10-09 DIAGNOSIS — M545 Low back pain: Secondary | ICD-10-CM | POA: Diagnosis not present

## 2018-10-09 DIAGNOSIS — G8929 Other chronic pain: Secondary | ICD-10-CM | POA: Diagnosis not present

## 2018-10-09 DIAGNOSIS — M1990 Unspecified osteoarthritis, unspecified site: Secondary | ICD-10-CM | POA: Diagnosis not present

## 2018-10-09 DIAGNOSIS — D649 Anemia, unspecified: Secondary | ICD-10-CM | POA: Diagnosis not present

## 2018-10-09 DIAGNOSIS — M47816 Spondylosis without myelopathy or radiculopathy, lumbar region: Secondary | ICD-10-CM | POA: Diagnosis not present

## 2018-10-09 DIAGNOSIS — D62 Acute posthemorrhagic anemia: Secondary | ICD-10-CM | POA: Diagnosis not present

## 2018-10-09 DIAGNOSIS — M5126 Other intervertebral disc displacement, lumbar region: Secondary | ICD-10-CM | POA: Diagnosis not present

## 2018-10-09 DIAGNOSIS — M25569 Pain in unspecified knee: Secondary | ICD-10-CM | POA: Diagnosis not present

## 2018-10-10 DIAGNOSIS — D509 Iron deficiency anemia, unspecified: Secondary | ICD-10-CM | POA: Diagnosis not present

## 2018-10-10 DIAGNOSIS — M545 Low back pain: Secondary | ICD-10-CM | POA: Diagnosis not present

## 2018-10-10 DIAGNOSIS — D649 Anemia, unspecified: Secondary | ICD-10-CM | POA: Diagnosis not present

## 2018-10-10 DIAGNOSIS — K449 Diaphragmatic hernia without obstruction or gangrene: Secondary | ICD-10-CM | POA: Diagnosis not present

## 2018-10-10 DIAGNOSIS — K3189 Other diseases of stomach and duodenum: Secondary | ICD-10-CM | POA: Diagnosis not present

## 2018-10-10 DIAGNOSIS — M25569 Pain in unspecified knee: Secondary | ICD-10-CM | POA: Diagnosis not present

## 2018-10-10 DIAGNOSIS — D62 Acute posthemorrhagic anemia: Secondary | ICD-10-CM | POA: Diagnosis not present

## 2018-10-10 DIAGNOSIS — K259 Gastric ulcer, unspecified as acute or chronic, without hemorrhage or perforation: Secondary | ICD-10-CM | POA: Diagnosis not present

## 2018-10-10 DIAGNOSIS — K922 Gastrointestinal hemorrhage, unspecified: Secondary | ICD-10-CM | POA: Diagnosis not present

## 2018-10-10 DIAGNOSIS — K921 Melena: Secondary | ICD-10-CM | POA: Diagnosis not present

## 2018-10-11 DIAGNOSIS — K922 Gastrointestinal hemorrhage, unspecified: Secondary | ICD-10-CM | POA: Diagnosis not present

## 2018-10-11 DIAGNOSIS — D649 Anemia, unspecified: Secondary | ICD-10-CM | POA: Diagnosis not present

## 2018-10-11 DIAGNOSIS — M549 Dorsalgia, unspecified: Secondary | ICD-10-CM | POA: Diagnosis not present

## 2018-10-26 DIAGNOSIS — R600 Localized edema: Secondary | ICD-10-CM | POA: Diagnosis not present

## 2018-10-26 DIAGNOSIS — L03116 Cellulitis of left lower limb: Secondary | ICD-10-CM | POA: Diagnosis not present

## 2018-10-26 DIAGNOSIS — E785 Hyperlipidemia, unspecified: Secondary | ICD-10-CM | POA: Diagnosis not present

## 2018-10-26 DIAGNOSIS — Z7982 Long term (current) use of aspirin: Secondary | ICD-10-CM | POA: Diagnosis not present

## 2018-10-26 DIAGNOSIS — Z885 Allergy status to narcotic agent status: Secondary | ICD-10-CM | POA: Diagnosis not present

## 2018-10-26 DIAGNOSIS — E119 Type 2 diabetes mellitus without complications: Secondary | ICD-10-CM | POA: Diagnosis not present

## 2018-10-26 DIAGNOSIS — G8929 Other chronic pain: Secondary | ICD-10-CM | POA: Diagnosis not present

## 2018-10-26 DIAGNOSIS — M7989 Other specified soft tissue disorders: Secondary | ICD-10-CM | POA: Diagnosis not present

## 2018-10-26 DIAGNOSIS — E7800 Pure hypercholesterolemia, unspecified: Secondary | ICD-10-CM | POA: Diagnosis not present

## 2018-10-26 DIAGNOSIS — M545 Low back pain: Secondary | ICD-10-CM | POA: Diagnosis not present

## 2018-10-26 DIAGNOSIS — R0602 Shortness of breath: Secondary | ICD-10-CM | POA: Diagnosis not present

## 2018-11-04 DIAGNOSIS — M5136 Other intervertebral disc degeneration, lumbar region: Secondary | ICD-10-CM | POA: Diagnosis not present

## 2018-11-04 DIAGNOSIS — G319 Degenerative disease of nervous system, unspecified: Secondary | ICD-10-CM | POA: Diagnosis not present

## 2018-11-04 DIAGNOSIS — M542 Cervicalgia: Secondary | ICD-10-CM | POA: Diagnosis not present

## 2018-11-04 DIAGNOSIS — M79641 Pain in right hand: Secondary | ICD-10-CM | POA: Diagnosis not present

## 2018-11-04 DIAGNOSIS — S3992XA Unspecified injury of lower back, initial encounter: Secondary | ICD-10-CM | POA: Diagnosis not present

## 2018-11-04 DIAGNOSIS — M79605 Pain in left leg: Secondary | ICD-10-CM | POA: Diagnosis not present

## 2018-11-04 DIAGNOSIS — S199XXA Unspecified injury of neck, initial encounter: Secondary | ICD-10-CM | POA: Diagnosis not present

## 2018-11-04 DIAGNOSIS — S299XXA Unspecified injury of thorax, initial encounter: Secondary | ICD-10-CM | POA: Diagnosis not present

## 2018-11-04 DIAGNOSIS — S3993XA Unspecified injury of pelvis, initial encounter: Secondary | ICD-10-CM | POA: Diagnosis not present

## 2018-11-04 DIAGNOSIS — E119 Type 2 diabetes mellitus without complications: Secondary | ICD-10-CM | POA: Diagnosis not present

## 2018-11-04 DIAGNOSIS — M25552 Pain in left hip: Secondary | ICD-10-CM | POA: Diagnosis not present

## 2018-11-04 DIAGNOSIS — M7989 Other specified soft tissue disorders: Secondary | ICD-10-CM | POA: Diagnosis not present

## 2018-11-04 DIAGNOSIS — E7800 Pure hypercholesterolemia, unspecified: Secondary | ICD-10-CM | POA: Diagnosis not present

## 2018-11-04 DIAGNOSIS — R911 Solitary pulmonary nodule: Secondary | ICD-10-CM | POA: Diagnosis not present

## 2018-11-04 DIAGNOSIS — Z7983 Long term (current) use of bisphosphonates: Secondary | ICD-10-CM | POA: Diagnosis not present

## 2018-11-04 DIAGNOSIS — M545 Low back pain: Secondary | ICD-10-CM | POA: Diagnosis not present

## 2018-11-04 DIAGNOSIS — Z7982 Long term (current) use of aspirin: Secondary | ICD-10-CM | POA: Diagnosis not present

## 2018-11-04 DIAGNOSIS — Z885 Allergy status to narcotic agent status: Secondary | ICD-10-CM | POA: Diagnosis not present

## 2018-11-04 DIAGNOSIS — S0990XA Unspecified injury of head, initial encounter: Secondary | ICD-10-CM | POA: Diagnosis not present

## 2018-11-04 DIAGNOSIS — M79642 Pain in left hand: Secondary | ICD-10-CM | POA: Diagnosis not present

## 2018-11-04 DIAGNOSIS — M25562 Pain in left knee: Secondary | ICD-10-CM | POA: Diagnosis not present

## 2018-11-04 DIAGNOSIS — K449 Diaphragmatic hernia without obstruction or gangrene: Secondary | ICD-10-CM | POA: Diagnosis not present

## 2018-11-04 DIAGNOSIS — E278 Other specified disorders of adrenal gland: Secondary | ICD-10-CM | POA: Diagnosis not present

## 2018-11-05 DIAGNOSIS — M25562 Pain in left knee: Secondary | ICD-10-CM | POA: Diagnosis not present

## 2018-11-19 DIAGNOSIS — Z7983 Long term (current) use of bisphosphonates: Secondary | ICD-10-CM | POA: Diagnosis not present

## 2018-11-19 DIAGNOSIS — E785 Hyperlipidemia, unspecified: Secondary | ICD-10-CM | POA: Diagnosis not present

## 2018-11-19 DIAGNOSIS — Z79899 Other long term (current) drug therapy: Secondary | ICD-10-CM | POA: Diagnosis not present

## 2018-11-19 DIAGNOSIS — G8929 Other chronic pain: Secondary | ICD-10-CM | POA: Diagnosis not present

## 2018-11-19 DIAGNOSIS — E7800 Pure hypercholesterolemia, unspecified: Secondary | ICD-10-CM | POA: Diagnosis not present

## 2018-11-19 DIAGNOSIS — Z7982 Long term (current) use of aspirin: Secondary | ICD-10-CM | POA: Diagnosis not present

## 2018-11-19 DIAGNOSIS — Z885 Allergy status to narcotic agent status: Secondary | ICD-10-CM | POA: Diagnosis not present

## 2018-11-19 DIAGNOSIS — E119 Type 2 diabetes mellitus without complications: Secondary | ICD-10-CM | POA: Diagnosis not present

## 2018-11-19 DIAGNOSIS — M545 Low back pain: Secondary | ICD-10-CM | POA: Diagnosis not present

## 2018-12-02 DIAGNOSIS — Z885 Allergy status to narcotic agent status: Secondary | ICD-10-CM | POA: Diagnosis not present

## 2018-12-02 DIAGNOSIS — Z7982 Long term (current) use of aspirin: Secondary | ICD-10-CM | POA: Diagnosis not present

## 2018-12-02 DIAGNOSIS — Z79899 Other long term (current) drug therapy: Secondary | ICD-10-CM | POA: Diagnosis not present

## 2018-12-02 DIAGNOSIS — R339 Retention of urine, unspecified: Secondary | ICD-10-CM | POA: Diagnosis not present

## 2018-12-02 DIAGNOSIS — Z87440 Personal history of urinary (tract) infections: Secondary | ICD-10-CM | POA: Diagnosis not present

## 2018-12-02 DIAGNOSIS — E119 Type 2 diabetes mellitus without complications: Secondary | ICD-10-CM | POA: Diagnosis not present

## 2018-12-02 DIAGNOSIS — E7800 Pure hypercholesterolemia, unspecified: Secondary | ICD-10-CM | POA: Diagnosis not present

## 2018-12-06 DIAGNOSIS — K449 Diaphragmatic hernia without obstruction or gangrene: Secondary | ICD-10-CM | POA: Diagnosis not present

## 2018-12-06 DIAGNOSIS — K250 Acute gastric ulcer with hemorrhage: Secondary | ICD-10-CM | POA: Diagnosis not present

## 2019-01-16 DIAGNOSIS — G8929 Other chronic pain: Secondary | ICD-10-CM | POA: Diagnosis not present

## 2019-01-16 DIAGNOSIS — K219 Gastro-esophageal reflux disease without esophagitis: Secondary | ICD-10-CM | POA: Diagnosis not present

## 2019-01-16 DIAGNOSIS — F419 Anxiety disorder, unspecified: Secondary | ICD-10-CM | POA: Diagnosis not present

## 2019-01-16 DIAGNOSIS — E119 Type 2 diabetes mellitus without complications: Secondary | ICD-10-CM | POA: Diagnosis not present

## 2019-01-16 DIAGNOSIS — M546 Pain in thoracic spine: Secondary | ICD-10-CM | POA: Diagnosis not present

## 2019-01-16 DIAGNOSIS — M810 Age-related osteoporosis without current pathological fracture: Secondary | ICD-10-CM | POA: Diagnosis not present

## 2019-01-16 DIAGNOSIS — Z0000 Encounter for general adult medical examination without abnormal findings: Secondary | ICD-10-CM | POA: Diagnosis not present

## 2019-01-16 DIAGNOSIS — E7800 Pure hypercholesterolemia, unspecified: Secondary | ICD-10-CM | POA: Diagnosis not present

## 2019-01-22 DIAGNOSIS — M47815 Spondylosis without myelopathy or radiculopathy, thoracolumbar region: Secondary | ICD-10-CM | POA: Diagnosis not present

## 2019-01-22 DIAGNOSIS — Z885 Allergy status to narcotic agent status: Secondary | ICD-10-CM | POA: Diagnosis not present

## 2019-01-22 DIAGNOSIS — G8929 Other chronic pain: Secondary | ICD-10-CM | POA: Diagnosis not present

## 2019-01-22 DIAGNOSIS — K219 Gastro-esophageal reflux disease without esophagitis: Secondary | ICD-10-CM | POA: Diagnosis not present

## 2019-01-22 DIAGNOSIS — E785 Hyperlipidemia, unspecified: Secondary | ICD-10-CM | POA: Diagnosis not present

## 2019-01-22 DIAGNOSIS — M545 Low back pain: Secondary | ICD-10-CM | POA: Diagnosis not present

## 2019-01-22 DIAGNOSIS — Z7983 Long term (current) use of bisphosphonates: Secondary | ICD-10-CM | POA: Diagnosis not present

## 2019-01-22 DIAGNOSIS — M1990 Unspecified osteoarthritis, unspecified site: Secondary | ICD-10-CM | POA: Diagnosis not present

## 2019-01-22 DIAGNOSIS — Z7982 Long term (current) use of aspirin: Secondary | ICD-10-CM | POA: Diagnosis not present

## 2019-01-22 DIAGNOSIS — M62830 Muscle spasm of back: Secondary | ICD-10-CM | POA: Diagnosis not present

## 2019-01-22 DIAGNOSIS — E119 Type 2 diabetes mellitus without complications: Secondary | ICD-10-CM | POA: Diagnosis not present

## 2019-01-22 DIAGNOSIS — M5135 Other intervertebral disc degeneration, thoracolumbar region: Secondary | ICD-10-CM | POA: Diagnosis not present

## 2019-01-22 DIAGNOSIS — M47814 Spondylosis without myelopathy or radiculopathy, thoracic region: Secondary | ICD-10-CM | POA: Diagnosis not present

## 2019-01-27 DIAGNOSIS — E119 Type 2 diabetes mellitus without complications: Secondary | ICD-10-CM | POA: Diagnosis not present

## 2019-01-27 DIAGNOSIS — G8929 Other chronic pain: Secondary | ICD-10-CM | POA: Diagnosis not present

## 2019-01-27 DIAGNOSIS — S0181XA Laceration without foreign body of other part of head, initial encounter: Secondary | ICD-10-CM | POA: Diagnosis not present

## 2019-01-27 DIAGNOSIS — S0990XA Unspecified injury of head, initial encounter: Secondary | ICD-10-CM | POA: Diagnosis not present

## 2019-01-27 DIAGNOSIS — S20212A Contusion of left front wall of thorax, initial encounter: Secondary | ICD-10-CM | POA: Diagnosis not present

## 2019-01-27 DIAGNOSIS — K449 Diaphragmatic hernia without obstruction or gangrene: Secondary | ICD-10-CM | POA: Diagnosis not present

## 2019-01-27 DIAGNOSIS — Z859 Personal history of malignant neoplasm, unspecified: Secondary | ICD-10-CM | POA: Diagnosis not present

## 2019-01-27 DIAGNOSIS — S199XXA Unspecified injury of neck, initial encounter: Secondary | ICD-10-CM | POA: Diagnosis not present

## 2019-01-27 DIAGNOSIS — R079 Chest pain, unspecified: Secondary | ICD-10-CM | POA: Diagnosis not present

## 2019-01-27 DIAGNOSIS — E7800 Pure hypercholesterolemia, unspecified: Secondary | ICD-10-CM | POA: Diagnosis not present

## 2019-01-27 DIAGNOSIS — M545 Low back pain: Secondary | ICD-10-CM | POA: Diagnosis not present

## 2019-01-27 DIAGNOSIS — R0781 Pleurodynia: Secondary | ICD-10-CM | POA: Diagnosis not present

## 2019-01-27 DIAGNOSIS — S0012XA Contusion of left eyelid and periocular area, initial encounter: Secondary | ICD-10-CM | POA: Diagnosis not present

## 2019-01-29 DIAGNOSIS — G8929 Other chronic pain: Secondary | ICD-10-CM | POA: Diagnosis not present

## 2019-01-29 DIAGNOSIS — R0789 Other chest pain: Secondary | ICD-10-CM | POA: Diagnosis not present

## 2019-01-29 DIAGNOSIS — S299XXA Unspecified injury of thorax, initial encounter: Secondary | ICD-10-CM | POA: Diagnosis not present

## 2019-01-29 DIAGNOSIS — S3993XA Unspecified injury of pelvis, initial encounter: Secondary | ICD-10-CM | POA: Diagnosis not present

## 2019-01-29 DIAGNOSIS — D509 Iron deficiency anemia, unspecified: Secondary | ICD-10-CM | POA: Diagnosis not present

## 2019-01-29 DIAGNOSIS — Z7982 Long term (current) use of aspirin: Secondary | ICD-10-CM | POA: Diagnosis not present

## 2019-01-29 DIAGNOSIS — K5750 Diverticulosis of both small and large intestine without perforation or abscess without bleeding: Secondary | ICD-10-CM | POA: Diagnosis not present

## 2019-01-29 DIAGNOSIS — I10 Essential (primary) hypertension: Secondary | ICD-10-CM | POA: Diagnosis not present

## 2019-01-29 DIAGNOSIS — E278 Other specified disorders of adrenal gland: Secondary | ICD-10-CM | POA: Diagnosis not present

## 2019-01-29 DIAGNOSIS — D62 Acute posthemorrhagic anemia: Secondary | ICD-10-CM | POA: Diagnosis not present

## 2019-01-29 DIAGNOSIS — M810 Age-related osteoporosis without current pathological fracture: Secondary | ICD-10-CM | POA: Diagnosis not present

## 2019-01-29 DIAGNOSIS — I440 Atrioventricular block, first degree: Secondary | ICD-10-CM | POA: Diagnosis not present

## 2019-01-29 DIAGNOSIS — K449 Diaphragmatic hernia without obstruction or gangrene: Secondary | ICD-10-CM | POA: Diagnosis not present

## 2019-01-29 DIAGNOSIS — M545 Low back pain: Secondary | ICD-10-CM | POA: Diagnosis not present

## 2019-01-29 DIAGNOSIS — R590 Localized enlarged lymph nodes: Secondary | ICD-10-CM | POA: Diagnosis not present

## 2019-01-29 DIAGNOSIS — S3991XA Unspecified injury of abdomen, initial encounter: Secondary | ICD-10-CM | POA: Diagnosis not present

## 2019-01-29 DIAGNOSIS — K219 Gastro-esophageal reflux disease without esophagitis: Secondary | ICD-10-CM | POA: Diagnosis not present

## 2019-01-30 DIAGNOSIS — F329 Major depressive disorder, single episode, unspecified: Secondary | ICD-10-CM | POA: Diagnosis not present

## 2019-01-30 DIAGNOSIS — D649 Anemia, unspecified: Secondary | ICD-10-CM | POA: Diagnosis not present

## 2019-01-30 DIAGNOSIS — D62 Acute posthemorrhagic anemia: Secondary | ICD-10-CM | POA: Diagnosis not present

## 2019-01-30 DIAGNOSIS — I10 Essential (primary) hypertension: Secondary | ICD-10-CM | POA: Diagnosis not present

## 2019-01-30 DIAGNOSIS — E785 Hyperlipidemia, unspecified: Secondary | ICD-10-CM | POA: Diagnosis not present

## 2019-01-30 DIAGNOSIS — E1140 Type 2 diabetes mellitus with diabetic neuropathy, unspecified: Secondary | ICD-10-CM | POA: Diagnosis not present

## 2019-01-31 DIAGNOSIS — I10 Essential (primary) hypertension: Secondary | ICD-10-CM | POA: Diagnosis not present

## 2019-01-31 DIAGNOSIS — E1140 Type 2 diabetes mellitus with diabetic neuropathy, unspecified: Secondary | ICD-10-CM | POA: Diagnosis not present

## 2019-01-31 DIAGNOSIS — R590 Localized enlarged lymph nodes: Secondary | ICD-10-CM | POA: Diagnosis not present

## 2019-01-31 DIAGNOSIS — D62 Acute posthemorrhagic anemia: Secondary | ICD-10-CM | POA: Diagnosis not present

## 2019-02-06 DIAGNOSIS — G8929 Other chronic pain: Secondary | ICD-10-CM | POA: Diagnosis not present

## 2019-02-06 DIAGNOSIS — E1140 Type 2 diabetes mellitus with diabetic neuropathy, unspecified: Secondary | ICD-10-CM | POA: Diagnosis not present

## 2019-02-06 DIAGNOSIS — D62 Acute posthemorrhagic anemia: Secondary | ICD-10-CM | POA: Diagnosis not present

## 2019-02-06 DIAGNOSIS — M810 Age-related osteoporosis without current pathological fracture: Secondary | ICD-10-CM | POA: Diagnosis not present

## 2019-02-06 DIAGNOSIS — M545 Low back pain: Secondary | ICD-10-CM | POA: Diagnosis not present

## 2019-02-06 DIAGNOSIS — R4189 Other symptoms and signs involving cognitive functions and awareness: Secondary | ICD-10-CM | POA: Diagnosis not present

## 2019-02-06 DIAGNOSIS — R590 Localized enlarged lymph nodes: Secondary | ICD-10-CM | POA: Diagnosis not present

## 2019-02-06 DIAGNOSIS — E611 Iron deficiency: Secondary | ICD-10-CM | POA: Diagnosis not present

## 2019-02-06 DIAGNOSIS — M1990 Unspecified osteoarthritis, unspecified site: Secondary | ICD-10-CM | POA: Diagnosis not present

## 2019-02-10 DIAGNOSIS — M1990 Unspecified osteoarthritis, unspecified site: Secondary | ICD-10-CM | POA: Diagnosis not present

## 2019-02-10 DIAGNOSIS — D62 Acute posthemorrhagic anemia: Secondary | ICD-10-CM | POA: Diagnosis not present

## 2019-02-10 DIAGNOSIS — M545 Low back pain: Secondary | ICD-10-CM | POA: Diagnosis not present

## 2019-02-10 DIAGNOSIS — R4189 Other symptoms and signs involving cognitive functions and awareness: Secondary | ICD-10-CM | POA: Diagnosis not present

## 2019-02-10 DIAGNOSIS — E611 Iron deficiency: Secondary | ICD-10-CM | POA: Diagnosis not present

## 2019-02-10 DIAGNOSIS — M810 Age-related osteoporosis without current pathological fracture: Secondary | ICD-10-CM | POA: Diagnosis not present

## 2019-02-10 DIAGNOSIS — E1140 Type 2 diabetes mellitus with diabetic neuropathy, unspecified: Secondary | ICD-10-CM | POA: Diagnosis not present

## 2019-02-10 DIAGNOSIS — R590 Localized enlarged lymph nodes: Secondary | ICD-10-CM | POA: Diagnosis not present

## 2019-02-10 DIAGNOSIS — G8929 Other chronic pain: Secondary | ICD-10-CM | POA: Diagnosis not present

## 2019-02-13 DIAGNOSIS — M810 Age-related osteoporosis without current pathological fracture: Secondary | ICD-10-CM | POA: Diagnosis not present

## 2019-02-13 DIAGNOSIS — E1140 Type 2 diabetes mellitus with diabetic neuropathy, unspecified: Secondary | ICD-10-CM | POA: Diagnosis not present

## 2019-02-13 DIAGNOSIS — M545 Low back pain: Secondary | ICD-10-CM | POA: Diagnosis not present

## 2019-02-13 DIAGNOSIS — G8929 Other chronic pain: Secondary | ICD-10-CM | POA: Diagnosis not present

## 2019-02-13 DIAGNOSIS — R590 Localized enlarged lymph nodes: Secondary | ICD-10-CM | POA: Diagnosis not present

## 2019-02-13 DIAGNOSIS — E611 Iron deficiency: Secondary | ICD-10-CM | POA: Diagnosis not present

## 2019-02-13 DIAGNOSIS — D62 Acute posthemorrhagic anemia: Secondary | ICD-10-CM | POA: Diagnosis not present

## 2019-02-13 DIAGNOSIS — R4189 Other symptoms and signs involving cognitive functions and awareness: Secondary | ICD-10-CM | POA: Diagnosis not present

## 2019-02-13 DIAGNOSIS — M1990 Unspecified osteoarthritis, unspecified site: Secondary | ICD-10-CM | POA: Diagnosis not present

## 2019-02-14 DIAGNOSIS — E1140 Type 2 diabetes mellitus with diabetic neuropathy, unspecified: Secondary | ICD-10-CM | POA: Diagnosis not present

## 2019-02-14 DIAGNOSIS — E611 Iron deficiency: Secondary | ICD-10-CM | POA: Diagnosis not present

## 2019-02-14 DIAGNOSIS — D62 Acute posthemorrhagic anemia: Secondary | ICD-10-CM | POA: Diagnosis not present

## 2019-02-14 DIAGNOSIS — M1990 Unspecified osteoarthritis, unspecified site: Secondary | ICD-10-CM | POA: Diagnosis not present

## 2019-02-14 DIAGNOSIS — R4189 Other symptoms and signs involving cognitive functions and awareness: Secondary | ICD-10-CM | POA: Diagnosis not present

## 2019-02-14 DIAGNOSIS — G8929 Other chronic pain: Secondary | ICD-10-CM | POA: Diagnosis not present

## 2019-02-14 DIAGNOSIS — M810 Age-related osteoporosis without current pathological fracture: Secondary | ICD-10-CM | POA: Diagnosis not present

## 2019-02-14 DIAGNOSIS — M545 Low back pain: Secondary | ICD-10-CM | POA: Diagnosis not present

## 2019-02-14 DIAGNOSIS — R590 Localized enlarged lymph nodes: Secondary | ICD-10-CM | POA: Diagnosis not present

## 2019-02-15 DIAGNOSIS — E1140 Type 2 diabetes mellitus with diabetic neuropathy, unspecified: Secondary | ICD-10-CM | POA: Diagnosis not present

## 2019-02-15 DIAGNOSIS — D62 Acute posthemorrhagic anemia: Secondary | ICD-10-CM | POA: Diagnosis not present

## 2019-02-15 DIAGNOSIS — R590 Localized enlarged lymph nodes: Secondary | ICD-10-CM | POA: Diagnosis not present

## 2019-02-15 DIAGNOSIS — M1990 Unspecified osteoarthritis, unspecified site: Secondary | ICD-10-CM | POA: Diagnosis not present

## 2019-02-15 DIAGNOSIS — M545 Low back pain: Secondary | ICD-10-CM | POA: Diagnosis not present

## 2019-02-15 DIAGNOSIS — E611 Iron deficiency: Secondary | ICD-10-CM | POA: Diagnosis not present

## 2019-02-15 DIAGNOSIS — R4189 Other symptoms and signs involving cognitive functions and awareness: Secondary | ICD-10-CM | POA: Diagnosis not present

## 2019-02-15 DIAGNOSIS — M810 Age-related osteoporosis without current pathological fracture: Secondary | ICD-10-CM | POA: Diagnosis not present

## 2019-02-15 DIAGNOSIS — G8929 Other chronic pain: Secondary | ICD-10-CM | POA: Diagnosis not present

## 2019-02-22 DIAGNOSIS — M1990 Unspecified osteoarthritis, unspecified site: Secondary | ICD-10-CM | POA: Diagnosis not present

## 2019-02-22 DIAGNOSIS — E1140 Type 2 diabetes mellitus with diabetic neuropathy, unspecified: Secondary | ICD-10-CM | POA: Diagnosis not present

## 2019-02-22 DIAGNOSIS — D62 Acute posthemorrhagic anemia: Secondary | ICD-10-CM | POA: Diagnosis not present

## 2019-02-22 DIAGNOSIS — E611 Iron deficiency: Secondary | ICD-10-CM | POA: Diagnosis not present

## 2019-02-22 DIAGNOSIS — M545 Low back pain: Secondary | ICD-10-CM | POA: Diagnosis not present

## 2019-02-22 DIAGNOSIS — R4189 Other symptoms and signs involving cognitive functions and awareness: Secondary | ICD-10-CM | POA: Diagnosis not present

## 2019-02-22 DIAGNOSIS — G8929 Other chronic pain: Secondary | ICD-10-CM | POA: Diagnosis not present

## 2019-02-22 DIAGNOSIS — M810 Age-related osteoporosis without current pathological fracture: Secondary | ICD-10-CM | POA: Diagnosis not present

## 2019-02-22 DIAGNOSIS — R590 Localized enlarged lymph nodes: Secondary | ICD-10-CM | POA: Diagnosis not present

## 2019-02-24 DIAGNOSIS — K219 Gastro-esophageal reflux disease without esophagitis: Secondary | ICD-10-CM | POA: Diagnosis not present

## 2019-02-24 DIAGNOSIS — E1140 Type 2 diabetes mellitus with diabetic neuropathy, unspecified: Secondary | ICD-10-CM | POA: Diagnosis not present

## 2019-02-24 DIAGNOSIS — M545 Low back pain: Secondary | ICD-10-CM | POA: Diagnosis not present

## 2019-02-24 DIAGNOSIS — G8929 Other chronic pain: Secondary | ICD-10-CM | POA: Diagnosis not present

## 2019-02-24 DIAGNOSIS — E611 Iron deficiency: Secondary | ICD-10-CM | POA: Diagnosis not present

## 2019-02-24 DIAGNOSIS — M1990 Unspecified osteoarthritis, unspecified site: Secondary | ICD-10-CM | POA: Diagnosis not present

## 2019-02-24 DIAGNOSIS — M810 Age-related osteoporosis without current pathological fracture: Secondary | ICD-10-CM | POA: Diagnosis not present

## 2019-02-24 DIAGNOSIS — R590 Localized enlarged lymph nodes: Secondary | ICD-10-CM | POA: Diagnosis not present

## 2019-02-27 DIAGNOSIS — Z20828 Contact with and (suspected) exposure to other viral communicable diseases: Secondary | ICD-10-CM | POA: Diagnosis not present

## 2019-02-27 DIAGNOSIS — F329 Major depressive disorder, single episode, unspecified: Secondary | ICD-10-CM | POA: Diagnosis not present

## 2019-02-27 DIAGNOSIS — E1140 Type 2 diabetes mellitus with diabetic neuropathy, unspecified: Secondary | ICD-10-CM | POA: Diagnosis not present

## 2019-02-27 DIAGNOSIS — R0602 Shortness of breath: Secondary | ICD-10-CM | POA: Diagnosis not present

## 2019-02-27 DIAGNOSIS — R1031 Right lower quadrant pain: Secondary | ICD-10-CM | POA: Diagnosis not present

## 2019-02-27 DIAGNOSIS — K449 Diaphragmatic hernia without obstruction or gangrene: Secondary | ICD-10-CM | POA: Diagnosis not present

## 2019-02-27 DIAGNOSIS — I10 Essential (primary) hypertension: Secondary | ICD-10-CM | POA: Diagnosis not present

## 2019-02-27 DIAGNOSIS — R1013 Epigastric pain: Secondary | ICD-10-CM | POA: Diagnosis not present

## 2019-02-27 DIAGNOSIS — K219 Gastro-esophageal reflux disease without esophagitis: Secondary | ICD-10-CM | POA: Diagnosis not present

## 2019-02-27 DIAGNOSIS — D509 Iron deficiency anemia, unspecified: Secondary | ICD-10-CM | POA: Diagnosis not present

## 2019-03-01 DIAGNOSIS — K219 Gastro-esophageal reflux disease without esophagitis: Secondary | ICD-10-CM | POA: Diagnosis not present

## 2019-03-01 DIAGNOSIS — M545 Low back pain: Secondary | ICD-10-CM | POA: Diagnosis not present

## 2019-03-01 DIAGNOSIS — D509 Iron deficiency anemia, unspecified: Secondary | ICD-10-CM | POA: Diagnosis not present

## 2019-03-01 DIAGNOSIS — R590 Localized enlarged lymph nodes: Secondary | ICD-10-CM | POA: Diagnosis not present

## 2019-03-01 DIAGNOSIS — E119 Type 2 diabetes mellitus without complications: Secondary | ICD-10-CM | POA: Diagnosis not present

## 2019-03-01 DIAGNOSIS — F419 Anxiety disorder, unspecified: Secondary | ICD-10-CM | POA: Diagnosis not present

## 2019-03-01 DIAGNOSIS — Z9181 History of falling: Secondary | ICD-10-CM | POA: Diagnosis not present

## 2019-03-01 DIAGNOSIS — M810 Age-related osteoporosis without current pathological fracture: Secondary | ICD-10-CM | POA: Diagnosis not present

## 2019-03-01 DIAGNOSIS — F0150 Vascular dementia without behavioral disturbance: Secondary | ICD-10-CM | POA: Diagnosis not present

## 2019-03-08 DIAGNOSIS — D509 Iron deficiency anemia, unspecified: Secondary | ICD-10-CM | POA: Diagnosis not present

## 2019-03-08 DIAGNOSIS — R634 Abnormal weight loss: Secondary | ICD-10-CM | POA: Diagnosis not present

## 2019-03-08 DIAGNOSIS — R5383 Other fatigue: Secondary | ICD-10-CM | POA: Diagnosis not present

## 2019-03-08 DIAGNOSIS — R630 Anorexia: Secondary | ICD-10-CM | POA: Diagnosis not present

## 2019-03-08 DIAGNOSIS — K210 Gastro-esophageal reflux disease with esophagitis: Secondary | ICD-10-CM | POA: Diagnosis not present

## 2019-03-08 DIAGNOSIS — G8929 Other chronic pain: Secondary | ICD-10-CM | POA: Diagnosis not present

## 2019-03-08 DIAGNOSIS — R590 Localized enlarged lymph nodes: Secondary | ICD-10-CM | POA: Diagnosis not present

## 2019-03-08 DIAGNOSIS — F0390 Unspecified dementia without behavioral disturbance: Secondary | ICD-10-CM | POA: Diagnosis not present

## 2019-03-08 DIAGNOSIS — Z6823 Body mass index (BMI) 23.0-23.9, adult: Secondary | ICD-10-CM | POA: Diagnosis not present

## 2019-03-14 DIAGNOSIS — R599 Enlarged lymph nodes, unspecified: Secondary | ICD-10-CM | POA: Diagnosis not present

## 2019-03-21 DIAGNOSIS — R590 Localized enlarged lymph nodes: Secondary | ICD-10-CM | POA: Diagnosis not present

## 2019-03-21 DIAGNOSIS — R634 Abnormal weight loss: Secondary | ICD-10-CM | POA: Diagnosis not present

## 2019-03-21 DIAGNOSIS — G8929 Other chronic pain: Secondary | ICD-10-CM | POA: Diagnosis not present

## 2019-03-21 DIAGNOSIS — R5383 Other fatigue: Secondary | ICD-10-CM | POA: Diagnosis not present

## 2019-03-21 DIAGNOSIS — D509 Iron deficiency anemia, unspecified: Secondary | ICD-10-CM | POA: Diagnosis not present

## 2019-03-21 DIAGNOSIS — K210 Gastro-esophageal reflux disease with esophagitis: Secondary | ICD-10-CM | POA: Diagnosis not present

## 2019-03-21 DIAGNOSIS — Z6823 Body mass index (BMI) 23.0-23.9, adult: Secondary | ICD-10-CM | POA: Diagnosis not present

## 2019-03-21 DIAGNOSIS — R630 Anorexia: Secondary | ICD-10-CM | POA: Diagnosis not present

## 2019-03-21 DIAGNOSIS — F0390 Unspecified dementia without behavioral disturbance: Secondary | ICD-10-CM | POA: Diagnosis not present

## 2019-03-27 DIAGNOSIS — K210 Gastro-esophageal reflux disease with esophagitis: Secondary | ICD-10-CM | POA: Diagnosis not present

## 2019-03-27 DIAGNOSIS — G8929 Other chronic pain: Secondary | ICD-10-CM | POA: Diagnosis not present

## 2019-03-27 DIAGNOSIS — F0390 Unspecified dementia without behavioral disturbance: Secondary | ICD-10-CM | POA: Diagnosis not present

## 2019-03-27 DIAGNOSIS — R590 Localized enlarged lymph nodes: Secondary | ICD-10-CM | POA: Diagnosis not present

## 2019-03-27 DIAGNOSIS — Z6823 Body mass index (BMI) 23.0-23.9, adult: Secondary | ICD-10-CM | POA: Diagnosis not present

## 2019-03-27 DIAGNOSIS — D509 Iron deficiency anemia, unspecified: Secondary | ICD-10-CM | POA: Diagnosis not present

## 2019-03-27 DIAGNOSIS — R634 Abnormal weight loss: Secondary | ICD-10-CM | POA: Diagnosis not present

## 2019-03-27 DIAGNOSIS — R630 Anorexia: Secondary | ICD-10-CM | POA: Diagnosis not present

## 2019-03-27 DIAGNOSIS — R5383 Other fatigue: Secondary | ICD-10-CM | POA: Diagnosis not present

## 2019-03-28 DIAGNOSIS — Z6823 Body mass index (BMI) 23.0-23.9, adult: Secondary | ICD-10-CM | POA: Diagnosis not present

## 2019-03-28 DIAGNOSIS — D509 Iron deficiency anemia, unspecified: Secondary | ICD-10-CM | POA: Diagnosis not present

## 2019-03-28 DIAGNOSIS — F0390 Unspecified dementia without behavioral disturbance: Secondary | ICD-10-CM | POA: Diagnosis not present

## 2019-03-28 DIAGNOSIS — R590 Localized enlarged lymph nodes: Secondary | ICD-10-CM | POA: Diagnosis not present

## 2019-03-28 DIAGNOSIS — K210 Gastro-esophageal reflux disease with esophagitis: Secondary | ICD-10-CM | POA: Diagnosis not present

## 2019-03-28 DIAGNOSIS — R630 Anorexia: Secondary | ICD-10-CM | POA: Diagnosis not present

## 2019-03-28 DIAGNOSIS — R634 Abnormal weight loss: Secondary | ICD-10-CM | POA: Diagnosis not present

## 2019-03-28 DIAGNOSIS — G8929 Other chronic pain: Secondary | ICD-10-CM | POA: Diagnosis not present

## 2019-03-28 DIAGNOSIS — R5383 Other fatigue: Secondary | ICD-10-CM | POA: Diagnosis not present

## 2019-03-29 DIAGNOSIS — F0390 Unspecified dementia without behavioral disturbance: Secondary | ICD-10-CM | POA: Diagnosis not present

## 2019-03-29 DIAGNOSIS — R5383 Other fatigue: Secondary | ICD-10-CM | POA: Diagnosis not present

## 2019-03-29 DIAGNOSIS — Z6823 Body mass index (BMI) 23.0-23.9, adult: Secondary | ICD-10-CM | POA: Diagnosis not present

## 2019-03-29 DIAGNOSIS — D509 Iron deficiency anemia, unspecified: Secondary | ICD-10-CM | POA: Diagnosis not present

## 2019-03-29 DIAGNOSIS — R630 Anorexia: Secondary | ICD-10-CM | POA: Diagnosis not present

## 2019-03-29 DIAGNOSIS — K210 Gastro-esophageal reflux disease with esophagitis: Secondary | ICD-10-CM | POA: Diagnosis not present

## 2019-03-29 DIAGNOSIS — G8929 Other chronic pain: Secondary | ICD-10-CM | POA: Diagnosis not present

## 2019-03-29 DIAGNOSIS — R590 Localized enlarged lymph nodes: Secondary | ICD-10-CM | POA: Diagnosis not present

## 2019-03-29 DIAGNOSIS — R634 Abnormal weight loss: Secondary | ICD-10-CM | POA: Diagnosis not present

## 2019-03-30 DIAGNOSIS — D509 Iron deficiency anemia, unspecified: Secondary | ICD-10-CM | POA: Diagnosis not present

## 2019-03-30 DIAGNOSIS — R590 Localized enlarged lymph nodes: Secondary | ICD-10-CM | POA: Diagnosis not present

## 2019-03-30 DIAGNOSIS — Z6823 Body mass index (BMI) 23.0-23.9, adult: Secondary | ICD-10-CM | POA: Diagnosis not present

## 2019-03-30 DIAGNOSIS — K210 Gastro-esophageal reflux disease with esophagitis: Secondary | ICD-10-CM | POA: Diagnosis not present

## 2019-03-30 DIAGNOSIS — R634 Abnormal weight loss: Secondary | ICD-10-CM | POA: Diagnosis not present

## 2019-03-30 DIAGNOSIS — F0390 Unspecified dementia without behavioral disturbance: Secondary | ICD-10-CM | POA: Diagnosis not present

## 2019-03-30 DIAGNOSIS — G8929 Other chronic pain: Secondary | ICD-10-CM | POA: Diagnosis not present

## 2019-03-30 DIAGNOSIS — R5383 Other fatigue: Secondary | ICD-10-CM | POA: Diagnosis not present

## 2019-03-30 DIAGNOSIS — R630 Anorexia: Secondary | ICD-10-CM | POA: Diagnosis not present

## 2019-04-06 DIAGNOSIS — R5383 Other fatigue: Secondary | ICD-10-CM | POA: Diagnosis not present

## 2019-04-06 DIAGNOSIS — R590 Localized enlarged lymph nodes: Secondary | ICD-10-CM | POA: Diagnosis not present

## 2019-04-06 DIAGNOSIS — R634 Abnormal weight loss: Secondary | ICD-10-CM | POA: Diagnosis not present

## 2019-04-06 DIAGNOSIS — K210 Gastro-esophageal reflux disease with esophagitis: Secondary | ICD-10-CM | POA: Diagnosis not present

## 2019-04-06 DIAGNOSIS — R630 Anorexia: Secondary | ICD-10-CM | POA: Diagnosis not present

## 2019-04-06 DIAGNOSIS — G8929 Other chronic pain: Secondary | ICD-10-CM | POA: Diagnosis not present

## 2019-04-06 DIAGNOSIS — Z6823 Body mass index (BMI) 23.0-23.9, adult: Secondary | ICD-10-CM | POA: Diagnosis not present

## 2019-04-06 DIAGNOSIS — F0390 Unspecified dementia without behavioral disturbance: Secondary | ICD-10-CM | POA: Diagnosis not present

## 2019-04-06 DIAGNOSIS — D509 Iron deficiency anemia, unspecified: Secondary | ICD-10-CM | POA: Diagnosis not present

## 2019-04-10 DIAGNOSIS — R634 Abnormal weight loss: Secondary | ICD-10-CM | POA: Diagnosis not present

## 2019-04-10 DIAGNOSIS — K210 Gastro-esophageal reflux disease with esophagitis: Secondary | ICD-10-CM | POA: Diagnosis not present

## 2019-04-10 DIAGNOSIS — G8929 Other chronic pain: Secondary | ICD-10-CM | POA: Diagnosis not present

## 2019-04-10 DIAGNOSIS — F0390 Unspecified dementia without behavioral disturbance: Secondary | ICD-10-CM | POA: Diagnosis not present

## 2019-04-10 DIAGNOSIS — R5383 Other fatigue: Secondary | ICD-10-CM | POA: Diagnosis not present

## 2019-04-10 DIAGNOSIS — R590 Localized enlarged lymph nodes: Secondary | ICD-10-CM | POA: Diagnosis not present

## 2019-04-10 DIAGNOSIS — R630 Anorexia: Secondary | ICD-10-CM | POA: Diagnosis not present

## 2019-04-10 DIAGNOSIS — D509 Iron deficiency anemia, unspecified: Secondary | ICD-10-CM | POA: Diagnosis not present

## 2019-04-10 DIAGNOSIS — Z6823 Body mass index (BMI) 23.0-23.9, adult: Secondary | ICD-10-CM | POA: Diagnosis not present

## 2019-04-20 DIAGNOSIS — E119 Type 2 diabetes mellitus without complications: Secondary | ICD-10-CM | POA: Diagnosis not present

## 2019-04-20 DIAGNOSIS — E7800 Pure hypercholesterolemia, unspecified: Secondary | ICD-10-CM | POA: Diagnosis not present

## 2019-04-21 DIAGNOSIS — M25462 Effusion, left knee: Secondary | ICD-10-CM | POA: Diagnosis not present

## 2019-04-21 DIAGNOSIS — M25562 Pain in left knee: Secondary | ICD-10-CM | POA: Diagnosis not present

## 2019-04-21 DIAGNOSIS — M7989 Other specified soft tissue disorders: Secondary | ICD-10-CM | POA: Diagnosis not present

## 2019-04-21 DIAGNOSIS — Z885 Allergy status to narcotic agent status: Secondary | ICD-10-CM | POA: Diagnosis not present

## 2019-04-21 DIAGNOSIS — M810 Age-related osteoporosis without current pathological fracture: Secondary | ICD-10-CM | POA: Diagnosis not present

## 2019-04-21 DIAGNOSIS — E785 Hyperlipidemia, unspecified: Secondary | ICD-10-CM | POA: Diagnosis not present

## 2019-04-21 DIAGNOSIS — E119 Type 2 diabetes mellitus without complications: Secondary | ICD-10-CM | POA: Diagnosis not present

## 2019-04-21 DIAGNOSIS — E7800 Pure hypercholesterolemia, unspecified: Secondary | ICD-10-CM | POA: Diagnosis not present

## 2019-04-21 DIAGNOSIS — Z79899 Other long term (current) drug therapy: Secondary | ICD-10-CM | POA: Diagnosis not present

## 2019-04-27 DIAGNOSIS — G8929 Other chronic pain: Secondary | ICD-10-CM | POA: Diagnosis not present

## 2019-04-27 DIAGNOSIS — F419 Anxiety disorder, unspecified: Secondary | ICD-10-CM | POA: Diagnosis not present

## 2019-04-27 DIAGNOSIS — K219 Gastro-esophageal reflux disease without esophagitis: Secondary | ICD-10-CM | POA: Diagnosis not present

## 2019-04-27 DIAGNOSIS — E1165 Type 2 diabetes mellitus with hyperglycemia: Secondary | ICD-10-CM | POA: Diagnosis not present

## 2019-04-27 DIAGNOSIS — E7800 Pure hypercholesterolemia, unspecified: Secondary | ICD-10-CM | POA: Diagnosis not present

## 2019-04-27 DIAGNOSIS — M810 Age-related osteoporosis without current pathological fracture: Secondary | ICD-10-CM | POA: Diagnosis not present

## 2019-04-27 DIAGNOSIS — M546 Pain in thoracic spine: Secondary | ICD-10-CM | POA: Diagnosis not present

## 2019-05-10 DIAGNOSIS — E1069 Type 1 diabetes mellitus with other specified complication: Secondary | ICD-10-CM | POA: Diagnosis not present

## 2019-05-10 DIAGNOSIS — F0390 Unspecified dementia without behavioral disturbance: Secondary | ICD-10-CM | POA: Diagnosis not present

## 2019-05-13 DIAGNOSIS — E119 Type 2 diabetes mellitus without complications: Secondary | ICD-10-CM | POA: Diagnosis not present

## 2019-05-13 DIAGNOSIS — E7800 Pure hypercholesterolemia, unspecified: Secondary | ICD-10-CM | POA: Diagnosis not present

## 2019-05-13 DIAGNOSIS — E785 Hyperlipidemia, unspecified: Secondary | ICD-10-CM | POA: Diagnosis not present

## 2019-05-13 DIAGNOSIS — I10 Essential (primary) hypertension: Secondary | ICD-10-CM | POA: Diagnosis not present

## 2019-05-13 DIAGNOSIS — K219 Gastro-esophageal reflux disease without esophagitis: Secondary | ICD-10-CM | POA: Diagnosis not present

## 2019-05-13 DIAGNOSIS — G44309 Post-traumatic headache, unspecified, not intractable: Secondary | ICD-10-CM | POA: Diagnosis not present

## 2019-05-13 DIAGNOSIS — M810 Age-related osteoporosis without current pathological fracture: Secondary | ICD-10-CM | POA: Diagnosis not present

## 2019-05-13 DIAGNOSIS — Z20828 Contact with and (suspected) exposure to other viral communicable diseases: Secondary | ICD-10-CM | POA: Diagnosis not present

## 2019-05-13 DIAGNOSIS — Z853 Personal history of malignant neoplasm of breast: Secondary | ICD-10-CM | POA: Diagnosis not present

## 2019-05-13 DIAGNOSIS — R51 Headache: Secondary | ICD-10-CM | POA: Diagnosis not present

## 2019-05-23 ENCOUNTER — Other Ambulatory Visit: Payer: Self-pay | Admitting: Family Medicine

## 2019-06-22 DIAGNOSIS — R339 Retention of urine, unspecified: Secondary | ICD-10-CM | POA: Diagnosis not present

## 2019-07-18 DIAGNOSIS — Z23 Encounter for immunization: Secondary | ICD-10-CM | POA: Diagnosis not present

## 2019-07-18 DIAGNOSIS — M546 Pain in thoracic spine: Secondary | ICD-10-CM | POA: Diagnosis not present

## 2019-07-18 DIAGNOSIS — F419 Anxiety disorder, unspecified: Secondary | ICD-10-CM | POA: Diagnosis not present

## 2019-07-18 DIAGNOSIS — E1165 Type 2 diabetes mellitus with hyperglycemia: Secondary | ICD-10-CM | POA: Diagnosis not present

## 2019-07-18 DIAGNOSIS — G8929 Other chronic pain: Secondary | ICD-10-CM | POA: Diagnosis not present

## 2019-07-18 DIAGNOSIS — M810 Age-related osteoporosis without current pathological fracture: Secondary | ICD-10-CM | POA: Diagnosis not present

## 2019-07-18 DIAGNOSIS — E7800 Pure hypercholesterolemia, unspecified: Secondary | ICD-10-CM | POA: Diagnosis not present

## 2019-10-19 DIAGNOSIS — E7800 Pure hypercholesterolemia, unspecified: Secondary | ICD-10-CM | POA: Diagnosis not present

## 2019-10-19 DIAGNOSIS — M546 Pain in thoracic spine: Secondary | ICD-10-CM | POA: Diagnosis not present

## 2019-10-19 DIAGNOSIS — E1165 Type 2 diabetes mellitus with hyperglycemia: Secondary | ICD-10-CM | POA: Diagnosis not present

## 2019-10-19 DIAGNOSIS — K219 Gastro-esophageal reflux disease without esophagitis: Secondary | ICD-10-CM | POA: Diagnosis not present

## 2019-10-19 DIAGNOSIS — G8929 Other chronic pain: Secondary | ICD-10-CM | POA: Diagnosis not present

## 2019-10-19 DIAGNOSIS — E119 Type 2 diabetes mellitus without complications: Secondary | ICD-10-CM | POA: Diagnosis not present

## 2019-10-19 DIAGNOSIS — M810 Age-related osteoporosis without current pathological fracture: Secondary | ICD-10-CM | POA: Diagnosis not present

## 2019-10-19 DIAGNOSIS — F419 Anxiety disorder, unspecified: Secondary | ICD-10-CM | POA: Diagnosis not present

## 2019-11-06 DIAGNOSIS — M5414 Radiculopathy, thoracic region: Secondary | ICD-10-CM | POA: Diagnosis not present

## 2019-11-06 DIAGNOSIS — M5416 Radiculopathy, lumbar region: Secondary | ICD-10-CM | POA: Diagnosis not present

## 2019-11-06 DIAGNOSIS — M62830 Muscle spasm of back: Secondary | ICD-10-CM | POA: Diagnosis not present

## 2019-11-06 DIAGNOSIS — M5134 Other intervertebral disc degeneration, thoracic region: Secondary | ICD-10-CM | POA: Diagnosis not present

## 2019-11-06 DIAGNOSIS — M5136 Other intervertebral disc degeneration, lumbar region: Secondary | ICD-10-CM | POA: Diagnosis not present

## 2020-02-06 DIAGNOSIS — F419 Anxiety disorder, unspecified: Secondary | ICD-10-CM | POA: Diagnosis not present

## 2020-02-06 DIAGNOSIS — G8929 Other chronic pain: Secondary | ICD-10-CM | POA: Diagnosis not present

## 2020-02-06 DIAGNOSIS — K219 Gastro-esophageal reflux disease without esophagitis: Secondary | ICD-10-CM | POA: Diagnosis not present

## 2020-02-06 DIAGNOSIS — Z0000 Encounter for general adult medical examination without abnormal findings: Secondary | ICD-10-CM | POA: Diagnosis not present

## 2020-02-06 DIAGNOSIS — E119 Type 2 diabetes mellitus without complications: Secondary | ICD-10-CM | POA: Diagnosis not present

## 2020-02-06 DIAGNOSIS — Z7984 Long term (current) use of oral hypoglycemic drugs: Secondary | ICD-10-CM | POA: Diagnosis not present

## 2020-02-06 DIAGNOSIS — E7800 Pure hypercholesterolemia, unspecified: Secondary | ICD-10-CM | POA: Diagnosis not present

## 2020-02-06 DIAGNOSIS — M546 Pain in thoracic spine: Secondary | ICD-10-CM | POA: Diagnosis not present

## 2020-02-06 DIAGNOSIS — M810 Age-related osteoporosis without current pathological fracture: Secondary | ICD-10-CM | POA: Diagnosis not present

## 2020-04-22 DIAGNOSIS — H0102B Squamous blepharitis left eye, upper and lower eyelids: Secondary | ICD-10-CM | POA: Diagnosis not present

## 2020-04-22 DIAGNOSIS — E119 Type 2 diabetes mellitus without complications: Secondary | ICD-10-CM | POA: Diagnosis not present

## 2020-04-22 DIAGNOSIS — Z961 Presence of intraocular lens: Secondary | ICD-10-CM | POA: Diagnosis not present

## 2020-04-22 DIAGNOSIS — H25012 Cortical age-related cataract, left eye: Secondary | ICD-10-CM | POA: Diagnosis not present

## 2020-05-10 DIAGNOSIS — M810 Age-related osteoporosis without current pathological fracture: Secondary | ICD-10-CM | POA: Diagnosis not present

## 2020-05-10 DIAGNOSIS — G8929 Other chronic pain: Secondary | ICD-10-CM | POA: Diagnosis not present

## 2020-05-10 DIAGNOSIS — K219 Gastro-esophageal reflux disease without esophagitis: Secondary | ICD-10-CM | POA: Diagnosis not present

## 2020-05-10 DIAGNOSIS — M546 Pain in thoracic spine: Secondary | ICD-10-CM | POA: Diagnosis not present

## 2020-05-10 DIAGNOSIS — E7800 Pure hypercholesterolemia, unspecified: Secondary | ICD-10-CM | POA: Diagnosis not present

## 2020-05-10 DIAGNOSIS — E119 Type 2 diabetes mellitus without complications: Secondary | ICD-10-CM | POA: Diagnosis not present

## 2020-05-10 DIAGNOSIS — F419 Anxiety disorder, unspecified: Secondary | ICD-10-CM | POA: Diagnosis not present

## 2020-05-14 DIAGNOSIS — Z961 Presence of intraocular lens: Secondary | ICD-10-CM | POA: Diagnosis not present

## 2020-05-14 DIAGNOSIS — H2589 Other age-related cataract: Secondary | ICD-10-CM | POA: Diagnosis not present

## 2020-05-19 DIAGNOSIS — E119 Type 2 diabetes mellitus without complications: Secondary | ICD-10-CM | POA: Diagnosis not present

## 2020-05-19 DIAGNOSIS — R0600 Dyspnea, unspecified: Secondary | ICD-10-CM | POA: Diagnosis not present

## 2020-05-19 DIAGNOSIS — E7800 Pure hypercholesterolemia, unspecified: Secondary | ICD-10-CM | POA: Diagnosis not present

## 2020-05-19 DIAGNOSIS — E785 Hyperlipidemia, unspecified: Secondary | ICD-10-CM | POA: Diagnosis not present

## 2020-05-19 DIAGNOSIS — Z853 Personal history of malignant neoplasm of breast: Secondary | ICD-10-CM | POA: Diagnosis not present

## 2020-05-19 DIAGNOSIS — R531 Weakness: Secondary | ICD-10-CM | POA: Diagnosis not present

## 2020-05-19 DIAGNOSIS — R0789 Other chest pain: Secondary | ICD-10-CM | POA: Diagnosis not present

## 2020-05-19 DIAGNOSIS — Z20822 Contact with and (suspected) exposure to covid-19: Secondary | ICD-10-CM | POA: Diagnosis not present

## 2020-05-19 DIAGNOSIS — R0602 Shortness of breath: Secondary | ICD-10-CM | POA: Diagnosis not present

## 2020-07-01 DIAGNOSIS — H2512 Age-related nuclear cataract, left eye: Secondary | ICD-10-CM | POA: Diagnosis not present

## 2020-07-25 DIAGNOSIS — H25812 Combined forms of age-related cataract, left eye: Secondary | ICD-10-CM | POA: Diagnosis not present

## 2020-08-11 DIAGNOSIS — S39012A Strain of muscle, fascia and tendon of lower back, initial encounter: Secondary | ICD-10-CM | POA: Diagnosis not present

## 2020-08-15 DIAGNOSIS — M62830 Muscle spasm of back: Secondary | ICD-10-CM | POA: Diagnosis not present

## 2020-08-15 DIAGNOSIS — M5136 Other intervertebral disc degeneration, lumbar region: Secondary | ICD-10-CM | POA: Diagnosis not present

## 2020-08-15 DIAGNOSIS — M5416 Radiculopathy, lumbar region: Secondary | ICD-10-CM | POA: Diagnosis not present

## 2020-08-15 DIAGNOSIS — M5414 Radiculopathy, thoracic region: Secondary | ICD-10-CM | POA: Diagnosis not present

## 2020-08-15 DIAGNOSIS — M5134 Other intervertebral disc degeneration, thoracic region: Secondary | ICD-10-CM | POA: Diagnosis not present

## 2020-08-26 DIAGNOSIS — E119 Type 2 diabetes mellitus without complications: Secondary | ICD-10-CM | POA: Diagnosis not present

## 2020-08-26 DIAGNOSIS — M810 Age-related osteoporosis without current pathological fracture: Secondary | ICD-10-CM | POA: Diagnosis not present

## 2020-08-26 DIAGNOSIS — K219 Gastro-esophageal reflux disease without esophagitis: Secondary | ICD-10-CM | POA: Diagnosis not present

## 2020-08-26 DIAGNOSIS — E7800 Pure hypercholesterolemia, unspecified: Secondary | ICD-10-CM | POA: Diagnosis not present

## 2020-08-26 DIAGNOSIS — M546 Pain in thoracic spine: Secondary | ICD-10-CM | POA: Diagnosis not present

## 2020-08-26 DIAGNOSIS — G8929 Other chronic pain: Secondary | ICD-10-CM | POA: Diagnosis not present

## 2020-08-26 DIAGNOSIS — F419 Anxiety disorder, unspecified: Secondary | ICD-10-CM | POA: Diagnosis not present

## 2020-08-29 DIAGNOSIS — E119 Type 2 diabetes mellitus without complications: Secondary | ICD-10-CM | POA: Diagnosis not present

## 2020-08-29 DIAGNOSIS — E7800 Pure hypercholesterolemia, unspecified: Secondary | ICD-10-CM | POA: Diagnosis not present

## 2020-09-04 ENCOUNTER — Other Ambulatory Visit: Payer: Self-pay | Admitting: Physical Medicine and Rehabilitation

## 2020-09-04 DIAGNOSIS — M5414 Radiculopathy, thoracic region: Secondary | ICD-10-CM | POA: Diagnosis not present

## 2020-09-04 DIAGNOSIS — M5441 Lumbago with sciatica, right side: Secondary | ICD-10-CM | POA: Diagnosis not present

## 2020-09-04 DIAGNOSIS — M5136 Other intervertebral disc degeneration, lumbar region: Secondary | ICD-10-CM | POA: Diagnosis not present

## 2020-09-04 DIAGNOSIS — M48062 Spinal stenosis, lumbar region with neurogenic claudication: Secondary | ICD-10-CM | POA: Diagnosis not present

## 2020-09-04 DIAGNOSIS — G8929 Other chronic pain: Secondary | ICD-10-CM

## 2020-09-04 DIAGNOSIS — M5442 Lumbago with sciatica, left side: Secondary | ICD-10-CM | POA: Diagnosis not present

## 2020-09-04 DIAGNOSIS — M62830 Muscle spasm of back: Secondary | ICD-10-CM | POA: Diagnosis not present

## 2020-09-04 DIAGNOSIS — M5416 Radiculopathy, lumbar region: Secondary | ICD-10-CM | POA: Diagnosis not present

## 2020-09-04 DIAGNOSIS — M5134 Other intervertebral disc degeneration, thoracic region: Secondary | ICD-10-CM | POA: Diagnosis not present

## 2020-09-14 ENCOUNTER — Ambulatory Visit
Admission: RE | Admit: 2020-09-14 | Discharge: 2020-09-14 | Disposition: A | Discharge status: Home / Self Care | Payer: Medicare HMO | Source: Ambulatory Visit | Attending: Physical Medicine and Rehabilitation | Admitting: Physical Medicine and Rehabilitation

## 2020-09-14 ENCOUNTER — Other Ambulatory Visit: Payer: Self-pay

## 2020-09-14 DIAGNOSIS — G8929 Other chronic pain: Secondary | ICD-10-CM | POA: Insufficient documentation

## 2020-09-14 DIAGNOSIS — M5442 Lumbago with sciatica, left side: Secondary | ICD-10-CM | POA: Diagnosis not present

## 2020-09-14 DIAGNOSIS — M545 Low back pain, unspecified: Secondary | ICD-10-CM | POA: Diagnosis not present

## 2020-10-10 DIAGNOSIS — M5136 Other intervertebral disc degeneration, lumbar region: Secondary | ICD-10-CM | POA: Diagnosis not present

## 2020-10-10 DIAGNOSIS — M48062 Spinal stenosis, lumbar region with neurogenic claudication: Secondary | ICD-10-CM | POA: Diagnosis not present

## 2020-10-10 DIAGNOSIS — M5416 Radiculopathy, lumbar region: Secondary | ICD-10-CM | POA: Diagnosis not present

## 2020-10-15 DIAGNOSIS — N390 Urinary tract infection, site not specified: Secondary | ICD-10-CM | POA: Diagnosis not present

## 2020-10-15 DIAGNOSIS — I081 Rheumatic disorders of both mitral and tricuspid valves: Secondary | ICD-10-CM | POA: Diagnosis not present

## 2020-10-15 DIAGNOSIS — K2100 Gastro-esophageal reflux disease with esophagitis, without bleeding: Secondary | ICD-10-CM | POA: Diagnosis not present

## 2020-10-15 DIAGNOSIS — R1032 Left lower quadrant pain: Secondary | ICD-10-CM | POA: Diagnosis not present

## 2020-10-15 DIAGNOSIS — G8929 Other chronic pain: Secondary | ICD-10-CM | POA: Diagnosis not present

## 2020-10-15 DIAGNOSIS — R531 Weakness: Secondary | ICD-10-CM | POA: Diagnosis not present

## 2020-10-15 DIAGNOSIS — E119 Type 2 diabetes mellitus without complications: Secondary | ICD-10-CM | POA: Diagnosis not present

## 2020-10-15 DIAGNOSIS — F0390 Unspecified dementia without behavioral disturbance: Secondary | ICD-10-CM | POA: Diagnosis not present

## 2020-10-15 DIAGNOSIS — R466 Undue concern and preoccupation with stressful events: Secondary | ICD-10-CM | POA: Diagnosis not present

## 2020-10-15 DIAGNOSIS — R933 Abnormal findings on diagnostic imaging of other parts of digestive tract: Secondary | ICD-10-CM | POA: Diagnosis not present

## 2020-10-15 DIAGNOSIS — R0602 Shortness of breath: Secondary | ICD-10-CM | POA: Diagnosis not present

## 2020-10-15 DIAGNOSIS — M5127 Other intervertebral disc displacement, lumbosacral region: Secondary | ICD-10-CM | POA: Diagnosis not present

## 2020-10-15 DIAGNOSIS — K529 Noninfective gastroenteritis and colitis, unspecified: Secondary | ICD-10-CM | POA: Diagnosis not present

## 2020-10-15 DIAGNOSIS — A09 Infectious gastroenteritis and colitis, unspecified: Secondary | ICD-10-CM | POA: Diagnosis not present

## 2020-10-15 DIAGNOSIS — E7800 Pure hypercholesterolemia, unspecified: Secondary | ICD-10-CM | POA: Diagnosis not present

## 2020-10-15 DIAGNOSIS — I4891 Unspecified atrial fibrillation: Secondary | ICD-10-CM | POA: Diagnosis not present

## 2020-10-15 DIAGNOSIS — D509 Iron deficiency anemia, unspecified: Secondary | ICD-10-CM | POA: Diagnosis not present

## 2020-10-15 DIAGNOSIS — K449 Diaphragmatic hernia without obstruction or gangrene: Secondary | ICD-10-CM | POA: Diagnosis not present

## 2020-10-15 DIAGNOSIS — K922 Gastrointestinal hemorrhage, unspecified: Secondary | ICD-10-CM | POA: Diagnosis not present

## 2020-10-15 DIAGNOSIS — Z20822 Contact with and (suspected) exposure to covid-19: Secondary | ICD-10-CM | POA: Diagnosis not present

## 2020-10-15 DIAGNOSIS — Z23 Encounter for immunization: Secondary | ICD-10-CM | POA: Diagnosis not present

## 2020-10-15 DIAGNOSIS — N3000 Acute cystitis without hematuria: Secondary | ICD-10-CM | POA: Diagnosis not present

## 2020-10-15 DIAGNOSIS — D649 Anemia, unspecified: Secondary | ICD-10-CM | POA: Diagnosis not present

## 2020-10-18 DIAGNOSIS — E7800 Pure hypercholesterolemia, unspecified: Secondary | ICD-10-CM | POA: Diagnosis not present

## 2020-10-18 DIAGNOSIS — E119 Type 2 diabetes mellitus without complications: Secondary | ICD-10-CM | POA: Diagnosis not present

## 2020-10-18 DIAGNOSIS — I4891 Unspecified atrial fibrillation: Secondary | ICD-10-CM | POA: Diagnosis not present

## 2020-10-18 DIAGNOSIS — M545 Low back pain, unspecified: Secondary | ICD-10-CM | POA: Diagnosis not present

## 2020-10-18 DIAGNOSIS — F0390 Unspecified dementia without behavioral disturbance: Secondary | ICD-10-CM | POA: Diagnosis not present

## 2020-10-18 DIAGNOSIS — K529 Noninfective gastroenteritis and colitis, unspecified: Secondary | ICD-10-CM | POA: Diagnosis not present

## 2020-10-18 DIAGNOSIS — E785 Hyperlipidemia, unspecified: Secondary | ICD-10-CM | POA: Diagnosis not present

## 2020-10-18 DIAGNOSIS — R531 Weakness: Secondary | ICD-10-CM | POA: Diagnosis not present

## 2020-10-18 DIAGNOSIS — N3000 Acute cystitis without hematuria: Secondary | ICD-10-CM | POA: Diagnosis not present

## 2020-10-21 DIAGNOSIS — E785 Hyperlipidemia, unspecified: Secondary | ICD-10-CM | POA: Diagnosis not present

## 2020-10-21 DIAGNOSIS — I4891 Unspecified atrial fibrillation: Secondary | ICD-10-CM | POA: Diagnosis not present

## 2020-10-21 DIAGNOSIS — E119 Type 2 diabetes mellitus without complications: Secondary | ICD-10-CM | POA: Diagnosis not present

## 2020-10-21 DIAGNOSIS — R531 Weakness: Secondary | ICD-10-CM | POA: Diagnosis not present

## 2020-10-21 DIAGNOSIS — M545 Low back pain, unspecified: Secondary | ICD-10-CM | POA: Diagnosis not present

## 2020-10-21 DIAGNOSIS — F0390 Unspecified dementia without behavioral disturbance: Secondary | ICD-10-CM | POA: Diagnosis not present

## 2020-10-21 DIAGNOSIS — N3000 Acute cystitis without hematuria: Secondary | ICD-10-CM | POA: Diagnosis not present

## 2020-10-21 DIAGNOSIS — K529 Noninfective gastroenteritis and colitis, unspecified: Secondary | ICD-10-CM | POA: Diagnosis not present

## 2020-10-21 DIAGNOSIS — E7800 Pure hypercholesterolemia, unspecified: Secondary | ICD-10-CM | POA: Diagnosis not present

## 2020-10-23 DIAGNOSIS — N3000 Acute cystitis without hematuria: Secondary | ICD-10-CM | POA: Diagnosis not present

## 2020-10-23 DIAGNOSIS — E119 Type 2 diabetes mellitus without complications: Secondary | ICD-10-CM | POA: Diagnosis not present

## 2020-10-23 DIAGNOSIS — E7800 Pure hypercholesterolemia, unspecified: Secondary | ICD-10-CM | POA: Diagnosis not present

## 2020-10-23 DIAGNOSIS — E785 Hyperlipidemia, unspecified: Secondary | ICD-10-CM | POA: Diagnosis not present

## 2020-10-23 DIAGNOSIS — K529 Noninfective gastroenteritis and colitis, unspecified: Secondary | ICD-10-CM | POA: Diagnosis not present

## 2020-10-23 DIAGNOSIS — I4891 Unspecified atrial fibrillation: Secondary | ICD-10-CM | POA: Diagnosis not present

## 2020-10-23 DIAGNOSIS — M545 Low back pain, unspecified: Secondary | ICD-10-CM | POA: Diagnosis not present

## 2020-10-23 DIAGNOSIS — R531 Weakness: Secondary | ICD-10-CM | POA: Diagnosis not present

## 2020-10-23 DIAGNOSIS — F0390 Unspecified dementia without behavioral disturbance: Secondary | ICD-10-CM | POA: Diagnosis not present

## 2020-10-28 DIAGNOSIS — Z961 Presence of intraocular lens: Secondary | ICD-10-CM | POA: Diagnosis not present

## 2020-10-29 DIAGNOSIS — F0390 Unspecified dementia without behavioral disturbance: Secondary | ICD-10-CM | POA: Diagnosis not present

## 2020-10-29 DIAGNOSIS — K529 Noninfective gastroenteritis and colitis, unspecified: Secondary | ICD-10-CM | POA: Diagnosis not present

## 2020-10-29 DIAGNOSIS — N3000 Acute cystitis without hematuria: Secondary | ICD-10-CM | POA: Diagnosis not present

## 2020-10-29 DIAGNOSIS — I4891 Unspecified atrial fibrillation: Secondary | ICD-10-CM | POA: Diagnosis not present

## 2020-10-29 DIAGNOSIS — E7800 Pure hypercholesterolemia, unspecified: Secondary | ICD-10-CM | POA: Diagnosis not present

## 2020-10-29 DIAGNOSIS — R531 Weakness: Secondary | ICD-10-CM | POA: Diagnosis not present

## 2020-10-29 DIAGNOSIS — M545 Low back pain, unspecified: Secondary | ICD-10-CM | POA: Diagnosis not present

## 2020-10-29 DIAGNOSIS — E785 Hyperlipidemia, unspecified: Secondary | ICD-10-CM | POA: Diagnosis not present

## 2020-10-29 DIAGNOSIS — E119 Type 2 diabetes mellitus without complications: Secondary | ICD-10-CM | POA: Diagnosis not present

## 2020-10-30 DIAGNOSIS — R531 Weakness: Secondary | ICD-10-CM | POA: Diagnosis not present

## 2020-10-30 DIAGNOSIS — N3000 Acute cystitis without hematuria: Secondary | ICD-10-CM | POA: Diagnosis not present

## 2020-10-30 DIAGNOSIS — E119 Type 2 diabetes mellitus without complications: Secondary | ICD-10-CM | POA: Diagnosis not present

## 2020-10-30 DIAGNOSIS — F0390 Unspecified dementia without behavioral disturbance: Secondary | ICD-10-CM | POA: Diagnosis not present

## 2020-10-30 DIAGNOSIS — K529 Noninfective gastroenteritis and colitis, unspecified: Secondary | ICD-10-CM | POA: Diagnosis not present

## 2020-10-30 DIAGNOSIS — I4891 Unspecified atrial fibrillation: Secondary | ICD-10-CM | POA: Diagnosis not present

## 2020-10-30 DIAGNOSIS — E7800 Pure hypercholesterolemia, unspecified: Secondary | ICD-10-CM | POA: Diagnosis not present

## 2020-11-04 DIAGNOSIS — M5441 Lumbago with sciatica, right side: Secondary | ICD-10-CM | POA: Diagnosis not present

## 2020-11-04 DIAGNOSIS — M5416 Radiculopathy, lumbar region: Secondary | ICD-10-CM | POA: Diagnosis not present

## 2020-11-04 DIAGNOSIS — M5442 Lumbago with sciatica, left side: Secondary | ICD-10-CM | POA: Diagnosis not present

## 2020-11-04 DIAGNOSIS — M62830 Muscle spasm of back: Secondary | ICD-10-CM | POA: Diagnosis not present

## 2020-11-04 DIAGNOSIS — M5134 Other intervertebral disc degeneration, thoracic region: Secondary | ICD-10-CM | POA: Diagnosis not present

## 2020-11-04 DIAGNOSIS — M48062 Spinal stenosis, lumbar region with neurogenic claudication: Secondary | ICD-10-CM | POA: Diagnosis not present

## 2020-11-04 DIAGNOSIS — M5136 Other intervertebral disc degeneration, lumbar region: Secondary | ICD-10-CM | POA: Diagnosis not present

## 2020-11-04 DIAGNOSIS — M5414 Radiculopathy, thoracic region: Secondary | ICD-10-CM | POA: Diagnosis not present

## 2020-11-06 DIAGNOSIS — K529 Noninfective gastroenteritis and colitis, unspecified: Secondary | ICD-10-CM | POA: Diagnosis not present

## 2020-11-06 DIAGNOSIS — M545 Low back pain, unspecified: Secondary | ICD-10-CM | POA: Diagnosis not present

## 2020-11-06 DIAGNOSIS — E7800 Pure hypercholesterolemia, unspecified: Secondary | ICD-10-CM | POA: Diagnosis not present

## 2020-11-06 DIAGNOSIS — N3000 Acute cystitis without hematuria: Secondary | ICD-10-CM | POA: Diagnosis not present

## 2020-11-06 DIAGNOSIS — E119 Type 2 diabetes mellitus without complications: Secondary | ICD-10-CM | POA: Diagnosis not present

## 2020-11-06 DIAGNOSIS — F0390 Unspecified dementia without behavioral disturbance: Secondary | ICD-10-CM | POA: Diagnosis not present

## 2020-11-06 DIAGNOSIS — E785 Hyperlipidemia, unspecified: Secondary | ICD-10-CM | POA: Diagnosis not present

## 2020-11-06 DIAGNOSIS — I4891 Unspecified atrial fibrillation: Secondary | ICD-10-CM | POA: Diagnosis not present

## 2020-11-06 DIAGNOSIS — R531 Weakness: Secondary | ICD-10-CM | POA: Diagnosis not present

## 2020-11-07 DIAGNOSIS — N3000 Acute cystitis without hematuria: Secondary | ICD-10-CM | POA: Diagnosis not present

## 2020-11-07 DIAGNOSIS — F0390 Unspecified dementia without behavioral disturbance: Secondary | ICD-10-CM | POA: Diagnosis not present

## 2020-11-07 DIAGNOSIS — I4891 Unspecified atrial fibrillation: Secondary | ICD-10-CM | POA: Diagnosis not present

## 2020-11-07 DIAGNOSIS — E119 Type 2 diabetes mellitus without complications: Secondary | ICD-10-CM | POA: Diagnosis not present

## 2020-11-07 DIAGNOSIS — R531 Weakness: Secondary | ICD-10-CM | POA: Diagnosis not present

## 2020-11-07 DIAGNOSIS — E7800 Pure hypercholesterolemia, unspecified: Secondary | ICD-10-CM | POA: Diagnosis not present

## 2020-11-07 DIAGNOSIS — K529 Noninfective gastroenteritis and colitis, unspecified: Secondary | ICD-10-CM | POA: Diagnosis not present

## 2020-11-07 DIAGNOSIS — M545 Low back pain, unspecified: Secondary | ICD-10-CM | POA: Diagnosis not present

## 2020-11-07 DIAGNOSIS — E785 Hyperlipidemia, unspecified: Secondary | ICD-10-CM | POA: Diagnosis not present

## 2020-11-12 DIAGNOSIS — K529 Noninfective gastroenteritis and colitis, unspecified: Secondary | ICD-10-CM | POA: Diagnosis not present

## 2020-11-12 DIAGNOSIS — E119 Type 2 diabetes mellitus without complications: Secondary | ICD-10-CM | POA: Diagnosis not present

## 2020-11-12 DIAGNOSIS — N3000 Acute cystitis without hematuria: Secondary | ICD-10-CM | POA: Diagnosis not present

## 2020-11-12 DIAGNOSIS — E785 Hyperlipidemia, unspecified: Secondary | ICD-10-CM | POA: Diagnosis not present

## 2020-11-12 DIAGNOSIS — M545 Low back pain, unspecified: Secondary | ICD-10-CM | POA: Diagnosis not present

## 2020-11-12 DIAGNOSIS — R531 Weakness: Secondary | ICD-10-CM | POA: Diagnosis not present

## 2020-11-12 DIAGNOSIS — I4891 Unspecified atrial fibrillation: Secondary | ICD-10-CM | POA: Diagnosis not present

## 2020-11-12 DIAGNOSIS — E7800 Pure hypercholesterolemia, unspecified: Secondary | ICD-10-CM | POA: Diagnosis not present

## 2020-11-12 DIAGNOSIS — F0390 Unspecified dementia without behavioral disturbance: Secondary | ICD-10-CM | POA: Diagnosis not present

## 2020-11-13 DIAGNOSIS — N3000 Acute cystitis without hematuria: Secondary | ICD-10-CM | POA: Diagnosis not present

## 2020-11-13 DIAGNOSIS — E785 Hyperlipidemia, unspecified: Secondary | ICD-10-CM | POA: Diagnosis not present

## 2020-11-13 DIAGNOSIS — E119 Type 2 diabetes mellitus without complications: Secondary | ICD-10-CM | POA: Diagnosis not present

## 2020-11-13 DIAGNOSIS — M545 Low back pain, unspecified: Secondary | ICD-10-CM | POA: Diagnosis not present

## 2020-11-13 DIAGNOSIS — R531 Weakness: Secondary | ICD-10-CM | POA: Diagnosis not present

## 2020-11-13 DIAGNOSIS — F0390 Unspecified dementia without behavioral disturbance: Secondary | ICD-10-CM | POA: Diagnosis not present

## 2020-11-13 DIAGNOSIS — E7800 Pure hypercholesterolemia, unspecified: Secondary | ICD-10-CM | POA: Diagnosis not present

## 2020-11-13 DIAGNOSIS — K529 Noninfective gastroenteritis and colitis, unspecified: Secondary | ICD-10-CM | POA: Diagnosis not present

## 2020-11-13 DIAGNOSIS — I4891 Unspecified atrial fibrillation: Secondary | ICD-10-CM | POA: Diagnosis not present

## 2020-11-14 DIAGNOSIS — M5416 Radiculopathy, lumbar region: Secondary | ICD-10-CM | POA: Diagnosis not present

## 2020-11-14 DIAGNOSIS — M5136 Other intervertebral disc degeneration, lumbar region: Secondary | ICD-10-CM | POA: Diagnosis not present

## 2020-11-14 DIAGNOSIS — M48062 Spinal stenosis, lumbar region with neurogenic claudication: Secondary | ICD-10-CM | POA: Diagnosis not present

## 2020-11-18 DIAGNOSIS — R531 Weakness: Secondary | ICD-10-CM | POA: Diagnosis not present

## 2020-11-18 DIAGNOSIS — M545 Low back pain, unspecified: Secondary | ICD-10-CM | POA: Diagnosis not present

## 2020-11-18 DIAGNOSIS — E119 Type 2 diabetes mellitus without complications: Secondary | ICD-10-CM | POA: Diagnosis not present

## 2020-11-18 DIAGNOSIS — K529 Noninfective gastroenteritis and colitis, unspecified: Secondary | ICD-10-CM | POA: Diagnosis not present

## 2020-11-18 DIAGNOSIS — E7800 Pure hypercholesterolemia, unspecified: Secondary | ICD-10-CM | POA: Diagnosis not present

## 2020-11-18 DIAGNOSIS — I4891 Unspecified atrial fibrillation: Secondary | ICD-10-CM | POA: Diagnosis not present

## 2020-11-18 DIAGNOSIS — E785 Hyperlipidemia, unspecified: Secondary | ICD-10-CM | POA: Diagnosis not present

## 2020-11-18 DIAGNOSIS — F0390 Unspecified dementia without behavioral disturbance: Secondary | ICD-10-CM | POA: Diagnosis not present

## 2020-11-18 DIAGNOSIS — N3000 Acute cystitis without hematuria: Secondary | ICD-10-CM | POA: Diagnosis not present

## 2020-11-20 DIAGNOSIS — M545 Low back pain, unspecified: Secondary | ICD-10-CM | POA: Diagnosis not present

## 2020-11-20 DIAGNOSIS — I4891 Unspecified atrial fibrillation: Secondary | ICD-10-CM | POA: Diagnosis not present

## 2020-11-20 DIAGNOSIS — F0390 Unspecified dementia without behavioral disturbance: Secondary | ICD-10-CM | POA: Diagnosis not present

## 2020-11-20 DIAGNOSIS — E119 Type 2 diabetes mellitus without complications: Secondary | ICD-10-CM | POA: Diagnosis not present

## 2020-11-20 DIAGNOSIS — E7800 Pure hypercholesterolemia, unspecified: Secondary | ICD-10-CM | POA: Diagnosis not present

## 2020-11-20 DIAGNOSIS — E785 Hyperlipidemia, unspecified: Secondary | ICD-10-CM | POA: Diagnosis not present

## 2020-11-20 DIAGNOSIS — N3000 Acute cystitis without hematuria: Secondary | ICD-10-CM | POA: Diagnosis not present

## 2020-11-20 DIAGNOSIS — R531 Weakness: Secondary | ICD-10-CM | POA: Diagnosis not present

## 2020-11-20 DIAGNOSIS — K529 Noninfective gastroenteritis and colitis, unspecified: Secondary | ICD-10-CM | POA: Diagnosis not present

## 2020-11-21 DIAGNOSIS — E7800 Pure hypercholesterolemia, unspecified: Secondary | ICD-10-CM | POA: Diagnosis not present

## 2020-11-21 DIAGNOSIS — F0390 Unspecified dementia without behavioral disturbance: Secondary | ICD-10-CM | POA: Diagnosis not present

## 2020-11-21 DIAGNOSIS — N3000 Acute cystitis without hematuria: Secondary | ICD-10-CM | POA: Diagnosis not present

## 2020-11-21 DIAGNOSIS — K529 Noninfective gastroenteritis and colitis, unspecified: Secondary | ICD-10-CM | POA: Diagnosis not present

## 2020-11-21 DIAGNOSIS — E119 Type 2 diabetes mellitus without complications: Secondary | ICD-10-CM | POA: Diagnosis not present

## 2020-11-21 DIAGNOSIS — E785 Hyperlipidemia, unspecified: Secondary | ICD-10-CM | POA: Diagnosis not present

## 2020-11-21 DIAGNOSIS — M545 Low back pain, unspecified: Secondary | ICD-10-CM | POA: Diagnosis not present

## 2020-11-21 DIAGNOSIS — I4891 Unspecified atrial fibrillation: Secondary | ICD-10-CM | POA: Diagnosis not present

## 2020-11-21 DIAGNOSIS — R531 Weakness: Secondary | ICD-10-CM | POA: Diagnosis not present

## 2020-11-25 DIAGNOSIS — K529 Noninfective gastroenteritis and colitis, unspecified: Secondary | ICD-10-CM | POA: Diagnosis not present

## 2020-11-25 DIAGNOSIS — M545 Low back pain, unspecified: Secondary | ICD-10-CM | POA: Diagnosis not present

## 2020-11-25 DIAGNOSIS — E119 Type 2 diabetes mellitus without complications: Secondary | ICD-10-CM | POA: Diagnosis not present

## 2020-11-25 DIAGNOSIS — E785 Hyperlipidemia, unspecified: Secondary | ICD-10-CM | POA: Diagnosis not present

## 2020-11-25 DIAGNOSIS — F0390 Unspecified dementia without behavioral disturbance: Secondary | ICD-10-CM | POA: Diagnosis not present

## 2020-11-25 DIAGNOSIS — N3000 Acute cystitis without hematuria: Secondary | ICD-10-CM | POA: Diagnosis not present

## 2020-11-25 DIAGNOSIS — I4891 Unspecified atrial fibrillation: Secondary | ICD-10-CM | POA: Diagnosis not present

## 2020-11-25 DIAGNOSIS — E7800 Pure hypercholesterolemia, unspecified: Secondary | ICD-10-CM | POA: Diagnosis not present

## 2020-11-25 DIAGNOSIS — R531 Weakness: Secondary | ICD-10-CM | POA: Diagnosis not present

## 2020-12-02 DIAGNOSIS — K529 Noninfective gastroenteritis and colitis, unspecified: Secondary | ICD-10-CM | POA: Diagnosis not present

## 2020-12-02 DIAGNOSIS — N3000 Acute cystitis without hematuria: Secondary | ICD-10-CM | POA: Diagnosis not present

## 2020-12-02 DIAGNOSIS — R531 Weakness: Secondary | ICD-10-CM | POA: Diagnosis not present

## 2020-12-02 DIAGNOSIS — I4891 Unspecified atrial fibrillation: Secondary | ICD-10-CM | POA: Diagnosis not present

## 2020-12-02 DIAGNOSIS — M545 Low back pain, unspecified: Secondary | ICD-10-CM | POA: Diagnosis not present

## 2020-12-02 DIAGNOSIS — E119 Type 2 diabetes mellitus without complications: Secondary | ICD-10-CM | POA: Diagnosis not present

## 2020-12-02 DIAGNOSIS — E785 Hyperlipidemia, unspecified: Secondary | ICD-10-CM | POA: Diagnosis not present

## 2020-12-02 DIAGNOSIS — F0390 Unspecified dementia without behavioral disturbance: Secondary | ICD-10-CM | POA: Diagnosis not present

## 2020-12-02 DIAGNOSIS — E7800 Pure hypercholesterolemia, unspecified: Secondary | ICD-10-CM | POA: Diagnosis not present

## 2020-12-11 DIAGNOSIS — H53132 Sudden visual loss, left eye: Secondary | ICD-10-CM | POA: Diagnosis not present

## 2020-12-16 DIAGNOSIS — M5136 Other intervertebral disc degeneration, lumbar region: Secondary | ICD-10-CM | POA: Diagnosis not present

## 2020-12-16 DIAGNOSIS — M47816 Spondylosis without myelopathy or radiculopathy, lumbar region: Secondary | ICD-10-CM | POA: Diagnosis not present

## 2020-12-16 DIAGNOSIS — M5416 Radiculopathy, lumbar region: Secondary | ICD-10-CM | POA: Diagnosis not present

## 2020-12-16 DIAGNOSIS — M48061 Spinal stenosis, lumbar region without neurogenic claudication: Secondary | ICD-10-CM | POA: Diagnosis not present

## 2020-12-19 DIAGNOSIS — M47816 Spondylosis without myelopathy or radiculopathy, lumbar region: Secondary | ICD-10-CM | POA: Diagnosis not present

## 2021-01-03 DIAGNOSIS — M47816 Spondylosis without myelopathy or radiculopathy, lumbar region: Secondary | ICD-10-CM | POA: Diagnosis not present

## 2021-01-13 DIAGNOSIS — Z961 Presence of intraocular lens: Secondary | ICD-10-CM | POA: Diagnosis not present

## 2021-01-13 DIAGNOSIS — Z7984 Long term (current) use of oral hypoglycemic drugs: Secondary | ICD-10-CM | POA: Diagnosis not present

## 2021-01-13 DIAGNOSIS — H53032 Strabismic amblyopia, left eye: Secondary | ICD-10-CM | POA: Diagnosis not present

## 2021-01-13 DIAGNOSIS — H5211 Myopia, right eye: Secondary | ICD-10-CM | POA: Diagnosis not present

## 2021-01-13 DIAGNOSIS — E119 Type 2 diabetes mellitus without complications: Secondary | ICD-10-CM | POA: Diagnosis not present

## 2021-01-13 DIAGNOSIS — H1789 Other corneal scars and opacities: Secondary | ICD-10-CM | POA: Diagnosis not present

## 2021-01-13 DIAGNOSIS — H524 Presbyopia: Secondary | ICD-10-CM | POA: Diagnosis not present

## 2021-01-19 DIAGNOSIS — G8929 Other chronic pain: Secondary | ICD-10-CM | POA: Diagnosis not present

## 2021-01-19 DIAGNOSIS — E785 Hyperlipidemia, unspecified: Secondary | ICD-10-CM | POA: Diagnosis not present

## 2021-01-19 DIAGNOSIS — K219 Gastro-esophageal reflux disease without esophagitis: Secondary | ICD-10-CM | POA: Diagnosis not present

## 2021-01-19 DIAGNOSIS — F32A Depression, unspecified: Secondary | ICD-10-CM | POA: Diagnosis not present

## 2021-01-19 DIAGNOSIS — E7800 Pure hypercholesterolemia, unspecified: Secondary | ICD-10-CM | POA: Diagnosis not present

## 2021-01-19 DIAGNOSIS — M1991 Primary osteoarthritis, unspecified site: Secondary | ICD-10-CM | POA: Diagnosis not present

## 2021-01-19 DIAGNOSIS — E119 Type 2 diabetes mellitus without complications: Secondary | ICD-10-CM | POA: Diagnosis not present

## 2021-01-19 DIAGNOSIS — M545 Low back pain, unspecified: Secondary | ICD-10-CM | POA: Diagnosis not present

## 2021-01-19 DIAGNOSIS — I4891 Unspecified atrial fibrillation: Secondary | ICD-10-CM | POA: Diagnosis not present

## 2021-01-25 DIAGNOSIS — W19XXXA Unspecified fall, initial encounter: Secondary | ICD-10-CM | POA: Diagnosis not present

## 2021-01-25 DIAGNOSIS — M48061 Spinal stenosis, lumbar region without neurogenic claudication: Secondary | ICD-10-CM | POA: Diagnosis not present

## 2021-01-25 DIAGNOSIS — S199XXA Unspecified injury of neck, initial encounter: Secondary | ICD-10-CM | POA: Diagnosis not present

## 2021-01-25 DIAGNOSIS — Z7984 Long term (current) use of oral hypoglycemic drugs: Secondary | ICD-10-CM | POA: Diagnosis not present

## 2021-01-25 DIAGNOSIS — M47816 Spondylosis without myelopathy or radiculopathy, lumbar region: Secondary | ICD-10-CM | POA: Diagnosis not present

## 2021-01-25 DIAGNOSIS — M5030 Other cervical disc degeneration, unspecified cervical region: Secondary | ICD-10-CM | POA: Diagnosis not present

## 2021-01-25 DIAGNOSIS — M4316 Spondylolisthesis, lumbar region: Secondary | ICD-10-CM | POA: Diagnosis not present

## 2021-01-25 DIAGNOSIS — M545 Low back pain, unspecified: Secondary | ICD-10-CM | POA: Diagnosis not present

## 2021-01-25 DIAGNOSIS — R918 Other nonspecific abnormal finding of lung field: Secondary | ICD-10-CM | POA: Diagnosis not present

## 2021-01-25 DIAGNOSIS — Z79899 Other long term (current) drug therapy: Secondary | ICD-10-CM | POA: Diagnosis not present

## 2021-01-25 DIAGNOSIS — M549 Dorsalgia, unspecified: Secondary | ICD-10-CM | POA: Diagnosis not present

## 2021-01-25 DIAGNOSIS — M5136 Other intervertebral disc degeneration, lumbar region: Secondary | ICD-10-CM | POA: Diagnosis not present

## 2021-01-25 DIAGNOSIS — E119 Type 2 diabetes mellitus without complications: Secondary | ICD-10-CM | POA: Diagnosis not present

## 2021-01-25 DIAGNOSIS — R519 Headache, unspecified: Secondary | ICD-10-CM | POA: Diagnosis not present

## 2021-01-25 DIAGNOSIS — Z043 Encounter for examination and observation following other accident: Secondary | ICD-10-CM | POA: Diagnosis not present

## 2021-01-25 DIAGNOSIS — E7800 Pure hypercholesterolemia, unspecified: Secondary | ICD-10-CM | POA: Diagnosis not present

## 2021-01-25 DIAGNOSIS — Z7901 Long term (current) use of anticoagulants: Secondary | ICD-10-CM | POA: Diagnosis not present

## 2021-01-25 DIAGNOSIS — M546 Pain in thoracic spine: Secondary | ICD-10-CM | POA: Diagnosis not present

## 2021-02-07 DIAGNOSIS — W010XXA Fall on same level from slipping, tripping and stumbling without subsequent striking against object, initial encounter: Secondary | ICD-10-CM | POA: Diagnosis not present

## 2021-02-07 DIAGNOSIS — R911 Solitary pulmonary nodule: Secondary | ICD-10-CM | POA: Diagnosis not present

## 2021-02-07 DIAGNOSIS — M5489 Other dorsalgia: Secondary | ICD-10-CM | POA: Diagnosis not present

## 2021-02-07 DIAGNOSIS — R531 Weakness: Secondary | ICD-10-CM | POA: Diagnosis not present

## 2021-02-07 DIAGNOSIS — G8929 Other chronic pain: Secondary | ICD-10-CM | POA: Diagnosis not present

## 2021-02-11 ENCOUNTER — Other Ambulatory Visit (HOSPITAL_COMMUNITY): Payer: Self-pay | Admitting: Family Medicine

## 2021-02-11 ENCOUNTER — Other Ambulatory Visit: Payer: Self-pay | Admitting: Family Medicine

## 2021-02-11 DIAGNOSIS — R911 Solitary pulmonary nodule: Secondary | ICD-10-CM

## 2021-02-13 DIAGNOSIS — I10 Essential (primary) hypertension: Secondary | ICD-10-CM | POA: Diagnosis not present

## 2021-02-13 DIAGNOSIS — G8921 Chronic pain due to trauma: Secondary | ICD-10-CM | POA: Diagnosis not present

## 2021-02-13 DIAGNOSIS — M545 Low back pain, unspecified: Secondary | ICD-10-CM | POA: Diagnosis not present

## 2021-02-13 DIAGNOSIS — R911 Solitary pulmonary nodule: Secondary | ICD-10-CM | POA: Diagnosis not present

## 2021-02-13 DIAGNOSIS — E785 Hyperlipidemia, unspecified: Secondary | ICD-10-CM | POA: Diagnosis not present

## 2021-02-13 DIAGNOSIS — M810 Age-related osteoporosis without current pathological fracture: Secondary | ICD-10-CM | POA: Diagnosis not present

## 2021-02-13 DIAGNOSIS — E119 Type 2 diabetes mellitus without complications: Secondary | ICD-10-CM | POA: Diagnosis not present

## 2021-02-13 DIAGNOSIS — F410 Panic disorder [episodic paroxysmal anxiety] without agoraphobia: Secondary | ICD-10-CM | POA: Diagnosis not present

## 2021-02-13 DIAGNOSIS — K219 Gastro-esophageal reflux disease without esophagitis: Secondary | ICD-10-CM | POA: Diagnosis not present

## 2021-02-14 DIAGNOSIS — E785 Hyperlipidemia, unspecified: Secondary | ICD-10-CM | POA: Diagnosis not present

## 2021-02-14 DIAGNOSIS — K219 Gastro-esophageal reflux disease without esophagitis: Secondary | ICD-10-CM | POA: Diagnosis not present

## 2021-02-14 DIAGNOSIS — F410 Panic disorder [episodic paroxysmal anxiety] without agoraphobia: Secondary | ICD-10-CM | POA: Diagnosis not present

## 2021-02-14 DIAGNOSIS — R911 Solitary pulmonary nodule: Secondary | ICD-10-CM | POA: Diagnosis not present

## 2021-02-14 DIAGNOSIS — E119 Type 2 diabetes mellitus without complications: Secondary | ICD-10-CM | POA: Diagnosis not present

## 2021-02-14 DIAGNOSIS — M545 Low back pain, unspecified: Secondary | ICD-10-CM | POA: Diagnosis not present

## 2021-02-14 DIAGNOSIS — I10 Essential (primary) hypertension: Secondary | ICD-10-CM | POA: Diagnosis not present

## 2021-02-14 DIAGNOSIS — G8921 Chronic pain due to trauma: Secondary | ICD-10-CM | POA: Diagnosis not present

## 2021-02-14 DIAGNOSIS — M810 Age-related osteoporosis without current pathological fracture: Secondary | ICD-10-CM | POA: Diagnosis not present

## 2021-02-18 DIAGNOSIS — M810 Age-related osteoporosis without current pathological fracture: Secondary | ICD-10-CM | POA: Diagnosis not present

## 2021-02-18 DIAGNOSIS — M545 Low back pain, unspecified: Secondary | ICD-10-CM | POA: Diagnosis not present

## 2021-02-18 DIAGNOSIS — G8921 Chronic pain due to trauma: Secondary | ICD-10-CM | POA: Diagnosis not present

## 2021-02-18 DIAGNOSIS — I10 Essential (primary) hypertension: Secondary | ICD-10-CM | POA: Diagnosis not present

## 2021-02-18 DIAGNOSIS — E785 Hyperlipidemia, unspecified: Secondary | ICD-10-CM | POA: Diagnosis not present

## 2021-02-18 DIAGNOSIS — E119 Type 2 diabetes mellitus without complications: Secondary | ICD-10-CM | POA: Diagnosis not present

## 2021-02-18 DIAGNOSIS — K219 Gastro-esophageal reflux disease without esophagitis: Secondary | ICD-10-CM | POA: Diagnosis not present

## 2021-02-19 DIAGNOSIS — M47816 Spondylosis without myelopathy or radiculopathy, lumbar region: Secondary | ICD-10-CM | POA: Diagnosis not present

## 2021-02-21 ENCOUNTER — Ambulatory Visit: Payer: Medicare HMO

## 2021-02-26 DIAGNOSIS — E785 Hyperlipidemia, unspecified: Secondary | ICD-10-CM | POA: Diagnosis not present

## 2021-02-26 DIAGNOSIS — K219 Gastro-esophageal reflux disease without esophagitis: Secondary | ICD-10-CM | POA: Diagnosis not present

## 2021-02-26 DIAGNOSIS — I10 Essential (primary) hypertension: Secondary | ICD-10-CM | POA: Diagnosis not present

## 2021-02-26 DIAGNOSIS — F410 Panic disorder [episodic paroxysmal anxiety] without agoraphobia: Secondary | ICD-10-CM | POA: Diagnosis not present

## 2021-02-26 DIAGNOSIS — G8921 Chronic pain due to trauma: Secondary | ICD-10-CM | POA: Diagnosis not present

## 2021-02-26 DIAGNOSIS — E119 Type 2 diabetes mellitus without complications: Secondary | ICD-10-CM | POA: Diagnosis not present

## 2021-02-26 DIAGNOSIS — M810 Age-related osteoporosis without current pathological fracture: Secondary | ICD-10-CM | POA: Diagnosis not present

## 2021-02-26 DIAGNOSIS — R911 Solitary pulmonary nodule: Secondary | ICD-10-CM | POA: Diagnosis not present

## 2021-02-26 DIAGNOSIS — M545 Low back pain, unspecified: Secondary | ICD-10-CM | POA: Diagnosis not present

## 2021-02-27 DIAGNOSIS — F410 Panic disorder [episodic paroxysmal anxiety] without agoraphobia: Secondary | ICD-10-CM | POA: Diagnosis not present

## 2021-02-27 DIAGNOSIS — E119 Type 2 diabetes mellitus without complications: Secondary | ICD-10-CM | POA: Diagnosis not present

## 2021-02-27 DIAGNOSIS — I10 Essential (primary) hypertension: Secondary | ICD-10-CM | POA: Diagnosis not present

## 2021-02-27 DIAGNOSIS — G8921 Chronic pain due to trauma: Secondary | ICD-10-CM | POA: Diagnosis not present

## 2021-02-27 DIAGNOSIS — K219 Gastro-esophageal reflux disease without esophagitis: Secondary | ICD-10-CM | POA: Diagnosis not present

## 2021-02-27 DIAGNOSIS — M545 Low back pain, unspecified: Secondary | ICD-10-CM | POA: Diagnosis not present

## 2021-02-27 DIAGNOSIS — R911 Solitary pulmonary nodule: Secondary | ICD-10-CM | POA: Diagnosis not present

## 2021-02-27 DIAGNOSIS — M810 Age-related osteoporosis without current pathological fracture: Secondary | ICD-10-CM | POA: Diagnosis not present

## 2021-02-27 DIAGNOSIS — E785 Hyperlipidemia, unspecified: Secondary | ICD-10-CM | POA: Diagnosis not present

## 2021-03-03 DIAGNOSIS — E7800 Pure hypercholesterolemia, unspecified: Secondary | ICD-10-CM | POA: Diagnosis not present

## 2021-03-03 DIAGNOSIS — F419 Anxiety disorder, unspecified: Secondary | ICD-10-CM | POA: Diagnosis not present

## 2021-03-03 DIAGNOSIS — M810 Age-related osteoporosis without current pathological fracture: Secondary | ICD-10-CM | POA: Diagnosis not present

## 2021-03-03 DIAGNOSIS — K219 Gastro-esophageal reflux disease without esophagitis: Secondary | ICD-10-CM | POA: Diagnosis not present

## 2021-03-03 DIAGNOSIS — G8929 Other chronic pain: Secondary | ICD-10-CM | POA: Diagnosis not present

## 2021-03-03 DIAGNOSIS — M546 Pain in thoracic spine: Secondary | ICD-10-CM | POA: Diagnosis not present

## 2021-03-03 DIAGNOSIS — E119 Type 2 diabetes mellitus without complications: Secondary | ICD-10-CM | POA: Diagnosis not present

## 2021-03-03 DIAGNOSIS — Z0000 Encounter for general adult medical examination without abnormal findings: Secondary | ICD-10-CM | POA: Diagnosis not present

## 2021-03-05 ENCOUNTER — Other Ambulatory Visit: Payer: Self-pay

## 2021-03-05 ENCOUNTER — Ambulatory Visit
Admission: RE | Admit: 2021-03-05 | Discharge: 2021-03-05 | Disposition: A | Discharge status: Home / Self Care | Payer: Medicare HMO | Source: Ambulatory Visit | Attending: Family Medicine | Admitting: Family Medicine

## 2021-03-05 DIAGNOSIS — K449 Diaphragmatic hernia without obstruction or gangrene: Secondary | ICD-10-CM | POA: Diagnosis not present

## 2021-03-05 DIAGNOSIS — R911 Solitary pulmonary nodule: Secondary | ICD-10-CM | POA: Diagnosis not present

## 2021-03-05 DIAGNOSIS — I2510 Atherosclerotic heart disease of native coronary artery without angina pectoris: Secondary | ICD-10-CM | POA: Diagnosis not present

## 2021-03-05 DIAGNOSIS — M5134 Other intervertebral disc degeneration, thoracic region: Secondary | ICD-10-CM | POA: Diagnosis not present

## 2021-03-05 DIAGNOSIS — Z853 Personal history of malignant neoplasm of breast: Secondary | ICD-10-CM | POA: Diagnosis not present

## 2021-03-06 DIAGNOSIS — R911 Solitary pulmonary nodule: Secondary | ICD-10-CM | POA: Diagnosis not present

## 2021-03-06 DIAGNOSIS — M810 Age-related osteoporosis without current pathological fracture: Secondary | ICD-10-CM | POA: Diagnosis not present

## 2021-03-06 DIAGNOSIS — F410 Panic disorder [episodic paroxysmal anxiety] without agoraphobia: Secondary | ICD-10-CM | POA: Diagnosis not present

## 2021-03-06 DIAGNOSIS — M545 Low back pain, unspecified: Secondary | ICD-10-CM | POA: Diagnosis not present

## 2021-03-06 DIAGNOSIS — I10 Essential (primary) hypertension: Secondary | ICD-10-CM | POA: Diagnosis not present

## 2021-03-06 DIAGNOSIS — E119 Type 2 diabetes mellitus without complications: Secondary | ICD-10-CM | POA: Diagnosis not present

## 2021-03-06 DIAGNOSIS — G8921 Chronic pain due to trauma: Secondary | ICD-10-CM | POA: Diagnosis not present

## 2021-03-06 DIAGNOSIS — E785 Hyperlipidemia, unspecified: Secondary | ICD-10-CM | POA: Diagnosis not present

## 2021-03-06 DIAGNOSIS — K219 Gastro-esophageal reflux disease without esophagitis: Secondary | ICD-10-CM | POA: Diagnosis not present

## 2021-03-10 DIAGNOSIS — D649 Anemia, unspecified: Secondary | ICD-10-CM | POA: Diagnosis not present

## 2021-03-10 DIAGNOSIS — K219 Gastro-esophageal reflux disease without esophagitis: Secondary | ICD-10-CM | POA: Diagnosis not present

## 2021-03-10 DIAGNOSIS — Z1211 Encounter for screening for malignant neoplasm of colon: Secondary | ICD-10-CM | POA: Diagnosis not present

## 2021-03-12 DIAGNOSIS — E119 Type 2 diabetes mellitus without complications: Secondary | ICD-10-CM | POA: Diagnosis not present

## 2021-03-12 DIAGNOSIS — M810 Age-related osteoporosis without current pathological fracture: Secondary | ICD-10-CM | POA: Diagnosis not present

## 2021-03-12 DIAGNOSIS — I10 Essential (primary) hypertension: Secondary | ICD-10-CM | POA: Diagnosis not present

## 2021-03-12 DIAGNOSIS — E785 Hyperlipidemia, unspecified: Secondary | ICD-10-CM | POA: Diagnosis not present

## 2021-03-12 DIAGNOSIS — K219 Gastro-esophageal reflux disease without esophagitis: Secondary | ICD-10-CM | POA: Diagnosis not present

## 2021-03-12 DIAGNOSIS — M545 Low back pain, unspecified: Secondary | ICD-10-CM | POA: Diagnosis not present

## 2021-03-12 DIAGNOSIS — R911 Solitary pulmonary nodule: Secondary | ICD-10-CM | POA: Diagnosis not present

## 2021-03-12 DIAGNOSIS — F410 Panic disorder [episodic paroxysmal anxiety] without agoraphobia: Secondary | ICD-10-CM | POA: Diagnosis not present

## 2021-03-12 DIAGNOSIS — G8921 Chronic pain due to trauma: Secondary | ICD-10-CM | POA: Diagnosis not present

## 2021-03-17 DIAGNOSIS — D649 Anemia, unspecified: Secondary | ICD-10-CM | POA: Diagnosis not present

## 2021-03-20 DIAGNOSIS — F410 Panic disorder [episodic paroxysmal anxiety] without agoraphobia: Secondary | ICD-10-CM | POA: Diagnosis not present

## 2021-03-20 DIAGNOSIS — R911 Solitary pulmonary nodule: Secondary | ICD-10-CM | POA: Diagnosis not present

## 2021-03-20 DIAGNOSIS — M545 Low back pain, unspecified: Secondary | ICD-10-CM | POA: Diagnosis not present

## 2021-03-20 DIAGNOSIS — M810 Age-related osteoporosis without current pathological fracture: Secondary | ICD-10-CM | POA: Diagnosis not present

## 2021-03-20 DIAGNOSIS — K219 Gastro-esophageal reflux disease without esophagitis: Secondary | ICD-10-CM | POA: Diagnosis not present

## 2021-03-20 DIAGNOSIS — G8921 Chronic pain due to trauma: Secondary | ICD-10-CM | POA: Diagnosis not present

## 2021-03-20 DIAGNOSIS — I10 Essential (primary) hypertension: Secondary | ICD-10-CM | POA: Diagnosis not present

## 2021-03-20 DIAGNOSIS — E119 Type 2 diabetes mellitus without complications: Secondary | ICD-10-CM | POA: Diagnosis not present

## 2021-03-20 DIAGNOSIS — E785 Hyperlipidemia, unspecified: Secondary | ICD-10-CM | POA: Diagnosis not present

## 2021-03-26 DIAGNOSIS — M48061 Spinal stenosis, lumbar region without neurogenic claudication: Secondary | ICD-10-CM | POA: Diagnosis not present

## 2021-03-26 DIAGNOSIS — M5416 Radiculopathy, lumbar region: Secondary | ICD-10-CM | POA: Diagnosis not present

## 2021-03-26 DIAGNOSIS — M5136 Other intervertebral disc degeneration, lumbar region: Secondary | ICD-10-CM | POA: Diagnosis not present

## 2021-04-10 DIAGNOSIS — R1031 Right lower quadrant pain: Secondary | ICD-10-CM | POA: Diagnosis not present

## 2021-04-10 DIAGNOSIS — R5383 Other fatigue: Secondary | ICD-10-CM | POA: Diagnosis not present

## 2021-04-10 DIAGNOSIS — D649 Anemia, unspecified: Secondary | ICD-10-CM | POA: Diagnosis not present

## 2021-04-10 DIAGNOSIS — U071 COVID-19: Secondary | ICD-10-CM | POA: Diagnosis not present

## 2021-04-10 DIAGNOSIS — K219 Gastro-esophageal reflux disease without esophagitis: Secondary | ICD-10-CM | POA: Diagnosis not present

## 2021-04-10 DIAGNOSIS — E7800 Pure hypercholesterolemia, unspecified: Secondary | ICD-10-CM | POA: Diagnosis not present

## 2021-04-10 DIAGNOSIS — K922 Gastrointestinal hemorrhage, unspecified: Secondary | ICD-10-CM | POA: Diagnosis not present

## 2021-04-10 DIAGNOSIS — K920 Hematemesis: Secondary | ICD-10-CM | POA: Diagnosis not present

## 2021-04-10 DIAGNOSIS — D509 Iron deficiency anemia, unspecified: Secondary | ICD-10-CM | POA: Diagnosis not present

## 2021-04-10 DIAGNOSIS — R197 Diarrhea, unspecified: Secondary | ICD-10-CM | POA: Diagnosis not present

## 2021-04-10 DIAGNOSIS — I4891 Unspecified atrial fibrillation: Secondary | ICD-10-CM | POA: Diagnosis not present

## 2021-04-10 DIAGNOSIS — F0390 Unspecified dementia without behavioral disturbance: Secondary | ICD-10-CM | POA: Diagnosis not present

## 2021-04-10 DIAGNOSIS — B342 Coronavirus infection, unspecified: Secondary | ICD-10-CM | POA: Diagnosis not present

## 2021-04-10 DIAGNOSIS — R519 Headache, unspecified: Secondary | ICD-10-CM | POA: Diagnosis not present

## 2021-04-10 DIAGNOSIS — B349 Viral infection, unspecified: Secondary | ICD-10-CM | POA: Diagnosis not present

## 2021-04-10 DIAGNOSIS — I959 Hypotension, unspecified: Secondary | ICD-10-CM | POA: Diagnosis not present

## 2021-04-10 DIAGNOSIS — R059 Cough, unspecified: Secondary | ICD-10-CM | POA: Diagnosis not present

## 2021-04-10 DIAGNOSIS — E785 Hyperlipidemia, unspecified: Secondary | ICD-10-CM | POA: Diagnosis not present

## 2021-04-10 DIAGNOSIS — E119 Type 2 diabetes mellitus without complications: Secondary | ICD-10-CM | POA: Diagnosis not present

## 2021-04-11 DIAGNOSIS — E119 Type 2 diabetes mellitus without complications: Secondary | ICD-10-CM | POA: Diagnosis not present

## 2021-04-11 DIAGNOSIS — E7800 Pure hypercholesterolemia, unspecified: Secondary | ICD-10-CM | POA: Diagnosis not present

## 2021-04-11 DIAGNOSIS — F0390 Unspecified dementia without behavioral disturbance: Secondary | ICD-10-CM | POA: Diagnosis not present

## 2021-04-11 DIAGNOSIS — D509 Iron deficiency anemia, unspecified: Secondary | ICD-10-CM | POA: Diagnosis not present

## 2021-04-11 DIAGNOSIS — U071 COVID-19: Secondary | ICD-10-CM | POA: Diagnosis not present

## 2021-04-11 DIAGNOSIS — E785 Hyperlipidemia, unspecified: Secondary | ICD-10-CM | POA: Diagnosis not present

## 2021-04-11 DIAGNOSIS — I4891 Unspecified atrial fibrillation: Secondary | ICD-10-CM | POA: Diagnosis not present

## 2021-04-11 DIAGNOSIS — I959 Hypotension, unspecified: Secondary | ICD-10-CM | POA: Diagnosis not present

## 2021-04-11 DIAGNOSIS — D649 Anemia, unspecified: Secondary | ICD-10-CM | POA: Diagnosis not present

## 2021-04-11 DIAGNOSIS — R1031 Right lower quadrant pain: Secondary | ICD-10-CM | POA: Diagnosis not present

## 2021-04-11 DIAGNOSIS — K920 Hematemesis: Secondary | ICD-10-CM | POA: Diagnosis not present

## 2021-04-11 DIAGNOSIS — K922 Gastrointestinal hemorrhage, unspecified: Secondary | ICD-10-CM | POA: Diagnosis not present

## 2021-04-11 DIAGNOSIS — K219 Gastro-esophageal reflux disease without esophagitis: Secondary | ICD-10-CM | POA: Diagnosis not present

## 2021-04-21 DIAGNOSIS — B342 Coronavirus infection, unspecified: Secondary | ICD-10-CM | POA: Diagnosis not present

## 2021-04-21 DIAGNOSIS — I499 Cardiac arrhythmia, unspecified: Secondary | ICD-10-CM | POA: Diagnosis not present

## 2021-04-21 DIAGNOSIS — Z7901 Long term (current) use of anticoagulants: Secondary | ICD-10-CM | POA: Diagnosis not present

## 2021-04-21 DIAGNOSIS — I4891 Unspecified atrial fibrillation: Secondary | ICD-10-CM | POA: Diagnosis not present

## 2021-04-21 DIAGNOSIS — Z7984 Long term (current) use of oral hypoglycemic drugs: Secondary | ICD-10-CM | POA: Diagnosis not present

## 2021-04-21 DIAGNOSIS — R55 Syncope and collapse: Secondary | ICD-10-CM | POA: Diagnosis not present

## 2021-04-21 DIAGNOSIS — F0390 Unspecified dementia without behavioral disturbance: Secondary | ICD-10-CM | POA: Diagnosis not present

## 2021-04-21 DIAGNOSIS — Z885 Allergy status to narcotic agent status: Secondary | ICD-10-CM | POA: Diagnosis not present

## 2021-04-21 DIAGNOSIS — N3000 Acute cystitis without hematuria: Secondary | ICD-10-CM | POA: Diagnosis not present

## 2021-04-21 DIAGNOSIS — E119 Type 2 diabetes mellitus without complications: Secondary | ICD-10-CM | POA: Diagnosis not present

## 2021-04-21 DIAGNOSIS — E7800 Pure hypercholesterolemia, unspecified: Secondary | ICD-10-CM | POA: Diagnosis not present

## 2021-04-25 DIAGNOSIS — Z1152 Encounter for screening for COVID-19: Secondary | ICD-10-CM | POA: Diagnosis not present

## 2021-04-28 DIAGNOSIS — Z1152 Encounter for screening for COVID-19: Secondary | ICD-10-CM | POA: Diagnosis not present

## 2021-05-06 DIAGNOSIS — R109 Unspecified abdominal pain: Secondary | ICD-10-CM | POA: Diagnosis not present

## 2021-05-06 DIAGNOSIS — S39012A Strain of muscle, fascia and tendon of lower back, initial encounter: Secondary | ICD-10-CM | POA: Diagnosis not present

## 2021-06-23 ENCOUNTER — Encounter: Admission: RE | Payer: Self-pay | Source: Home / Self Care

## 2021-06-23 ENCOUNTER — Ambulatory Visit: Admission: RE | Admit: 2021-06-23 | Payer: Medicare HMO | Source: Home / Self Care

## 2021-06-23 SURGERY — EGD (ESOPHAGOGASTRODUODENOSCOPY)
Anesthesia: General

## 2021-07-22 DIAGNOSIS — R5381 Other malaise: Secondary | ICD-10-CM | POA: Diagnosis not present

## 2021-07-22 DIAGNOSIS — K5900 Constipation, unspecified: Secondary | ICD-10-CM | POA: Diagnosis not present

## 2021-07-22 DIAGNOSIS — M810 Age-related osteoporosis without current pathological fracture: Secondary | ICD-10-CM | POA: Diagnosis not present

## 2021-07-22 DIAGNOSIS — E7800 Pure hypercholesterolemia, unspecified: Secondary | ICD-10-CM | POA: Diagnosis not present

## 2021-07-22 DIAGNOSIS — R630 Anorexia: Secondary | ICD-10-CM | POA: Diagnosis not present

## 2021-07-22 DIAGNOSIS — E1140 Type 2 diabetes mellitus with diabetic neuropathy, unspecified: Secondary | ICD-10-CM | POA: Diagnosis not present

## 2021-07-22 DIAGNOSIS — R110 Nausea: Secondary | ICD-10-CM | POA: Diagnosis not present

## 2021-07-22 DIAGNOSIS — R1084 Generalized abdominal pain: Secondary | ICD-10-CM | POA: Diagnosis not present

## 2021-07-22 DIAGNOSIS — K440 Diaphragmatic hernia with obstruction, without gangrene: Secondary | ICD-10-CM | POA: Diagnosis not present

## 2021-07-22 DIAGNOSIS — E119 Type 2 diabetes mellitus without complications: Secondary | ICD-10-CM | POA: Diagnosis not present

## 2021-07-22 DIAGNOSIS — I4891 Unspecified atrial fibrillation: Secondary | ICD-10-CM | POA: Diagnosis not present

## 2021-07-22 DIAGNOSIS — K449 Diaphragmatic hernia without obstruction or gangrene: Secondary | ICD-10-CM | POA: Diagnosis not present

## 2021-07-22 DIAGNOSIS — Z8719 Personal history of other diseases of the digestive system: Secondary | ICD-10-CM | POA: Diagnosis not present

## 2021-07-22 DIAGNOSIS — Z20822 Contact with and (suspected) exposure to covid-19: Secondary | ICD-10-CM | POA: Diagnosis not present

## 2021-07-22 DIAGNOSIS — R10813 Right lower quadrant abdominal tenderness: Secondary | ICD-10-CM | POA: Diagnosis not present

## 2021-07-22 DIAGNOSIS — Z23 Encounter for immunization: Secondary | ICD-10-CM | POA: Diagnosis not present

## 2021-07-23 DIAGNOSIS — R110 Nausea: Secondary | ICD-10-CM | POA: Diagnosis not present

## 2021-07-23 DIAGNOSIS — E119 Type 2 diabetes mellitus without complications: Secondary | ICD-10-CM | POA: Diagnosis not present

## 2021-07-23 DIAGNOSIS — R630 Anorexia: Secondary | ICD-10-CM | POA: Diagnosis not present

## 2021-07-23 DIAGNOSIS — D649 Anemia, unspecified: Secondary | ICD-10-CM | POA: Diagnosis not present

## 2021-07-23 DIAGNOSIS — K449 Diaphragmatic hernia without obstruction or gangrene: Secondary | ICD-10-CM | POA: Diagnosis not present

## 2021-07-23 DIAGNOSIS — I4891 Unspecified atrial fibrillation: Secondary | ICD-10-CM | POA: Diagnosis not present

## 2021-07-24 DIAGNOSIS — K449 Diaphragmatic hernia without obstruction or gangrene: Secondary | ICD-10-CM | POA: Diagnosis not present

## 2021-07-24 DIAGNOSIS — E119 Type 2 diabetes mellitus without complications: Secondary | ICD-10-CM | POA: Diagnosis not present

## 2021-07-24 DIAGNOSIS — D649 Anemia, unspecified: Secondary | ICD-10-CM | POA: Diagnosis not present

## 2021-07-24 DIAGNOSIS — I10 Essential (primary) hypertension: Secondary | ICD-10-CM | POA: Diagnosis not present

## 2021-07-24 DIAGNOSIS — K219 Gastro-esophageal reflux disease without esophagitis: Secondary | ICD-10-CM | POA: Diagnosis not present

## 2021-07-24 DIAGNOSIS — I4891 Unspecified atrial fibrillation: Secondary | ICD-10-CM | POA: Diagnosis not present

## 2021-07-25 DIAGNOSIS — I10 Essential (primary) hypertension: Secondary | ICD-10-CM | POA: Diagnosis not present

## 2021-07-25 DIAGNOSIS — K449 Diaphragmatic hernia without obstruction or gangrene: Secondary | ICD-10-CM | POA: Diagnosis not present

## 2021-07-25 DIAGNOSIS — I4891 Unspecified atrial fibrillation: Secondary | ICD-10-CM | POA: Diagnosis not present

## 2021-07-25 DIAGNOSIS — E119 Type 2 diabetes mellitus without complications: Secondary | ICD-10-CM | POA: Diagnosis not present

## 2021-08-07 DIAGNOSIS — R112 Nausea with vomiting, unspecified: Secondary | ICD-10-CM | POA: Diagnosis not present

## 2021-08-07 DIAGNOSIS — E785 Hyperlipidemia, unspecified: Secondary | ICD-10-CM | POA: Diagnosis not present

## 2021-08-07 DIAGNOSIS — R110 Nausea: Secondary | ICD-10-CM | POA: Diagnosis not present

## 2021-08-07 DIAGNOSIS — I4891 Unspecified atrial fibrillation: Secondary | ICD-10-CM | POA: Diagnosis not present

## 2021-08-07 DIAGNOSIS — R1033 Periumbilical pain: Secondary | ICD-10-CM | POA: Diagnosis not present

## 2021-08-07 DIAGNOSIS — R339 Retention of urine, unspecified: Secondary | ICD-10-CM | POA: Diagnosis not present

## 2021-08-07 DIAGNOSIS — E119 Type 2 diabetes mellitus without complications: Secondary | ICD-10-CM | POA: Diagnosis not present

## 2021-08-07 DIAGNOSIS — M810 Age-related osteoporosis without current pathological fracture: Secondary | ICD-10-CM | POA: Diagnosis not present

## 2021-08-07 DIAGNOSIS — K449 Diaphragmatic hernia without obstruction or gangrene: Secondary | ICD-10-CM | POA: Diagnosis not present

## 2021-08-07 DIAGNOSIS — E7800 Pure hypercholesterolemia, unspecified: Secondary | ICD-10-CM | POA: Diagnosis not present

## 2021-08-07 DIAGNOSIS — R1032 Left lower quadrant pain: Secondary | ICD-10-CM | POA: Diagnosis not present

## 2021-08-07 DIAGNOSIS — E860 Dehydration: Secondary | ICD-10-CM | POA: Diagnosis not present

## 2021-08-07 DIAGNOSIS — F32A Depression, unspecified: Secondary | ICD-10-CM | POA: Diagnosis not present

## 2021-08-14 DIAGNOSIS — R339 Retention of urine, unspecified: Secondary | ICD-10-CM | POA: Diagnosis not present

## 2021-09-04 DIAGNOSIS — E7800 Pure hypercholesterolemia, unspecified: Secondary | ICD-10-CM | POA: Diagnosis not present

## 2021-09-04 DIAGNOSIS — E119 Type 2 diabetes mellitus without complications: Secondary | ICD-10-CM | POA: Diagnosis not present

## 2021-09-04 DIAGNOSIS — K449 Diaphragmatic hernia without obstruction or gangrene: Secondary | ICD-10-CM | POA: Diagnosis not present

## 2021-09-04 DIAGNOSIS — K219 Gastro-esophageal reflux disease without esophagitis: Secondary | ICD-10-CM | POA: Diagnosis not present

## 2021-09-04 DIAGNOSIS — R630 Anorexia: Secondary | ICD-10-CM | POA: Diagnosis not present

## 2021-09-04 DIAGNOSIS — M546 Pain in thoracic spine: Secondary | ICD-10-CM | POA: Diagnosis not present

## 2021-09-04 DIAGNOSIS — G8929 Other chronic pain: Secondary | ICD-10-CM | POA: Diagnosis not present

## 2021-09-04 DIAGNOSIS — M810 Age-related osteoporosis without current pathological fracture: Secondary | ICD-10-CM | POA: Diagnosis not present

## 2021-09-04 DIAGNOSIS — F419 Anxiety disorder, unspecified: Secondary | ICD-10-CM | POA: Diagnosis not present

## 2021-11-20 DIAGNOSIS — M810 Age-related osteoporosis without current pathological fracture: Secondary | ICD-10-CM | POA: Diagnosis not present

## 2021-11-26 DIAGNOSIS — E1140 Type 2 diabetes mellitus with diabetic neuropathy, unspecified: Secondary | ICD-10-CM | POA: Diagnosis not present

## 2021-11-26 DIAGNOSIS — Z833 Family history of diabetes mellitus: Secondary | ICD-10-CM | POA: Diagnosis not present

## 2021-11-26 DIAGNOSIS — I4891 Unspecified atrial fibrillation: Secondary | ICD-10-CM | POA: Diagnosis not present

## 2021-11-26 DIAGNOSIS — E1165 Type 2 diabetes mellitus with hyperglycemia: Secondary | ICD-10-CM | POA: Diagnosis not present

## 2021-11-26 DIAGNOSIS — D6869 Other thrombophilia: Secondary | ICD-10-CM | POA: Diagnosis not present

## 2021-11-26 DIAGNOSIS — E1136 Type 2 diabetes mellitus with diabetic cataract: Secondary | ICD-10-CM | POA: Diagnosis not present

## 2021-11-26 DIAGNOSIS — I10 Essential (primary) hypertension: Secondary | ICD-10-CM | POA: Diagnosis not present

## 2021-11-26 DIAGNOSIS — Z8249 Family history of ischemic heart disease and other diseases of the circulatory system: Secondary | ICD-10-CM | POA: Diagnosis not present

## 2021-11-26 DIAGNOSIS — Z7982 Long term (current) use of aspirin: Secondary | ICD-10-CM | POA: Diagnosis not present

## 2021-11-26 DIAGNOSIS — F3342 Major depressive disorder, recurrent, in full remission: Secondary | ICD-10-CM | POA: Diagnosis not present

## 2021-11-26 DIAGNOSIS — Z7983 Long term (current) use of bisphosphonates: Secondary | ICD-10-CM | POA: Diagnosis not present

## 2021-11-26 DIAGNOSIS — E785 Hyperlipidemia, unspecified: Secondary | ICD-10-CM | POA: Diagnosis not present

## 2021-11-26 DIAGNOSIS — Z853 Personal history of malignant neoplasm of breast: Secondary | ICD-10-CM | POA: Diagnosis not present

## 2021-11-26 DIAGNOSIS — F0390 Unspecified dementia without behavioral disturbance: Secondary | ICD-10-CM | POA: Diagnosis not present

## 2021-11-26 DIAGNOSIS — Z7984 Long term (current) use of oral hypoglycemic drugs: Secondary | ICD-10-CM | POA: Diagnosis not present

## 2021-11-26 DIAGNOSIS — M1990 Unspecified osteoarthritis, unspecified site: Secondary | ICD-10-CM | POA: Diagnosis not present

## 2021-12-25 DIAGNOSIS — K449 Diaphragmatic hernia without obstruction or gangrene: Secondary | ICD-10-CM | POA: Diagnosis not present

## 2021-12-25 DIAGNOSIS — I4892 Unspecified atrial flutter: Secondary | ICD-10-CM | POA: Diagnosis not present

## 2021-12-25 DIAGNOSIS — R339 Retention of urine, unspecified: Secondary | ICD-10-CM | POA: Diagnosis not present

## 2021-12-25 DIAGNOSIS — E278 Other specified disorders of adrenal gland: Secondary | ICD-10-CM | POA: Diagnosis not present

## 2021-12-25 DIAGNOSIS — E860 Dehydration: Secondary | ICD-10-CM | POA: Diagnosis not present

## 2021-12-25 DIAGNOSIS — F0280 Dementia in other diseases classified elsewhere without behavioral disturbance: Secondary | ICD-10-CM | POA: Diagnosis not present

## 2021-12-25 DIAGNOSIS — E7800 Pure hypercholesterolemia, unspecified: Secondary | ICD-10-CM | POA: Diagnosis not present

## 2021-12-25 DIAGNOSIS — Z7901 Long term (current) use of anticoagulants: Secondary | ICD-10-CM | POA: Diagnosis not present

## 2021-12-25 DIAGNOSIS — F01B0 Vascular dementia, moderate, without behavioral disturbance, psychotic disturbance, mood disturbance, and anxiety: Secondary | ICD-10-CM | POA: Diagnosis not present

## 2021-12-25 DIAGNOSIS — K5730 Diverticulosis of large intestine without perforation or abscess without bleeding: Secondary | ICD-10-CM | POA: Diagnosis not present

## 2021-12-25 DIAGNOSIS — I480 Paroxysmal atrial fibrillation: Secondary | ICD-10-CM | POA: Diagnosis not present

## 2021-12-25 DIAGNOSIS — I4891 Unspecified atrial fibrillation: Secondary | ICD-10-CM | POA: Diagnosis not present

## 2021-12-25 DIAGNOSIS — I10 Essential (primary) hypertension: Secondary | ICD-10-CM | POA: Diagnosis not present

## 2021-12-25 DIAGNOSIS — R531 Weakness: Secondary | ICD-10-CM | POA: Diagnosis not present

## 2021-12-25 DIAGNOSIS — R1013 Epigastric pain: Secondary | ICD-10-CM | POA: Diagnosis not present

## 2021-12-25 DIAGNOSIS — F0390 Unspecified dementia without behavioral disturbance: Secondary | ICD-10-CM | POA: Diagnosis not present

## 2021-12-25 DIAGNOSIS — D509 Iron deficiency anemia, unspecified: Secondary | ICD-10-CM | POA: Diagnosis not present

## 2021-12-25 DIAGNOSIS — I5030 Unspecified diastolic (congestive) heart failure: Secondary | ICD-10-CM | POA: Diagnosis not present

## 2021-12-25 DIAGNOSIS — G9341 Metabolic encephalopathy: Secondary | ICD-10-CM | POA: Diagnosis not present

## 2021-12-25 DIAGNOSIS — E119 Type 2 diabetes mellitus without complications: Secondary | ICD-10-CM | POA: Diagnosis not present

## 2021-12-25 DIAGNOSIS — Z20822 Contact with and (suspected) exposure to covid-19: Secondary | ICD-10-CM | POA: Diagnosis not present

## 2021-12-25 DIAGNOSIS — R9431 Abnormal electrocardiogram [ECG] [EKG]: Secondary | ICD-10-CM | POA: Diagnosis not present

## 2021-12-25 DIAGNOSIS — K5900 Constipation, unspecified: Secondary | ICD-10-CM | POA: Diagnosis not present

## 2021-12-25 DIAGNOSIS — R100 Acute abdomen: Secondary | ICD-10-CM | POA: Diagnosis not present

## 2021-12-26 DIAGNOSIS — I4892 Unspecified atrial flutter: Secondary | ICD-10-CM | POA: Diagnosis not present

## 2021-12-26 DIAGNOSIS — I480 Paroxysmal atrial fibrillation: Secondary | ICD-10-CM | POA: Diagnosis not present

## 2021-12-29 DIAGNOSIS — I4891 Unspecified atrial fibrillation: Secondary | ICD-10-CM | POA: Diagnosis not present

## 2021-12-31 DIAGNOSIS — R1031 Right lower quadrant pain: Secondary | ICD-10-CM | POA: Diagnosis not present

## 2021-12-31 DIAGNOSIS — F05 Delirium due to known physiological condition: Secondary | ICD-10-CM | POA: Diagnosis not present

## 2021-12-31 DIAGNOSIS — A419 Sepsis, unspecified organism: Secondary | ICD-10-CM | POA: Diagnosis not present

## 2021-12-31 DIAGNOSIS — E611 Iron deficiency: Secondary | ICD-10-CM | POA: Diagnosis not present

## 2021-12-31 DIAGNOSIS — Z20822 Contact with and (suspected) exposure to covid-19: Secondary | ICD-10-CM | POA: Diagnosis not present

## 2021-12-31 DIAGNOSIS — F419 Anxiety disorder, unspecified: Secondary | ICD-10-CM | POA: Diagnosis not present

## 2021-12-31 DIAGNOSIS — R509 Fever, unspecified: Secondary | ICD-10-CM | POA: Diagnosis not present

## 2021-12-31 DIAGNOSIS — R339 Retention of urine, unspecified: Secondary | ICD-10-CM | POA: Diagnosis not present

## 2021-12-31 DIAGNOSIS — J90 Pleural effusion, not elsewhere classified: Secondary | ICD-10-CM | POA: Diagnosis not present

## 2021-12-31 DIAGNOSIS — E785 Hyperlipidemia, unspecified: Secondary | ICD-10-CM | POA: Diagnosis not present

## 2021-12-31 DIAGNOSIS — R7401 Elevation of levels of liver transaminase levels: Secondary | ICD-10-CM | POA: Diagnosis not present

## 2021-12-31 DIAGNOSIS — M810 Age-related osteoporosis without current pathological fracture: Secondary | ICD-10-CM | POA: Diagnosis not present

## 2021-12-31 DIAGNOSIS — M1990 Unspecified osteoarthritis, unspecified site: Secondary | ICD-10-CM | POA: Diagnosis not present

## 2021-12-31 DIAGNOSIS — R1030 Lower abdominal pain, unspecified: Secondary | ICD-10-CM | POA: Diagnosis not present

## 2021-12-31 DIAGNOSIS — K449 Diaphragmatic hernia without obstruction or gangrene: Secondary | ICD-10-CM | POA: Diagnosis not present

## 2021-12-31 DIAGNOSIS — R5383 Other fatigue: Secondary | ICD-10-CM | POA: Diagnosis not present

## 2021-12-31 DIAGNOSIS — I4891 Unspecified atrial fibrillation: Secondary | ICD-10-CM | POA: Diagnosis not present

## 2021-12-31 DIAGNOSIS — R1110 Vomiting, unspecified: Secondary | ICD-10-CM | POA: Diagnosis not present

## 2021-12-31 DIAGNOSIS — Z7901 Long term (current) use of anticoagulants: Secondary | ICD-10-CM | POA: Diagnosis not present

## 2021-12-31 DIAGNOSIS — D509 Iron deficiency anemia, unspecified: Secondary | ICD-10-CM | POA: Diagnosis not present

## 2021-12-31 DIAGNOSIS — J849 Interstitial pulmonary disease, unspecified: Secondary | ICD-10-CM | POA: Diagnosis not present

## 2021-12-31 DIAGNOSIS — E860 Dehydration: Secondary | ICD-10-CM | POA: Diagnosis not present

## 2021-12-31 DIAGNOSIS — E1122 Type 2 diabetes mellitus with diabetic chronic kidney disease: Secondary | ICD-10-CM | POA: Diagnosis not present

## 2021-12-31 DIAGNOSIS — N189 Chronic kidney disease, unspecified: Secondary | ICD-10-CM | POA: Diagnosis not present

## 2021-12-31 DIAGNOSIS — R0902 Hypoxemia: Secondary | ICD-10-CM | POA: Diagnosis not present

## 2021-12-31 DIAGNOSIS — Z9981 Dependence on supplemental oxygen: Secondary | ICD-10-CM | POA: Diagnosis not present

## 2021-12-31 DIAGNOSIS — F0390 Unspecified dementia without behavioral disturbance: Secondary | ICD-10-CM | POA: Diagnosis not present

## 2021-12-31 DIAGNOSIS — E119 Type 2 diabetes mellitus without complications: Secondary | ICD-10-CM | POA: Diagnosis not present

## 2021-12-31 DIAGNOSIS — R110 Nausea: Secondary | ICD-10-CM | POA: Diagnosis not present

## 2021-12-31 DIAGNOSIS — I5032 Chronic diastolic (congestive) heart failure: Secondary | ICD-10-CM | POA: Diagnosis not present

## 2021-12-31 DIAGNOSIS — I480 Paroxysmal atrial fibrillation: Secondary | ICD-10-CM | POA: Diagnosis not present

## 2021-12-31 DIAGNOSIS — R918 Other nonspecific abnormal finding of lung field: Secondary | ICD-10-CM | POA: Diagnosis not present

## 2021-12-31 DIAGNOSIS — K219 Gastro-esophageal reflux disease without esophagitis: Secondary | ICD-10-CM | POA: Diagnosis not present

## 2021-12-31 DIAGNOSIS — R1013 Epigastric pain: Secondary | ICD-10-CM | POA: Diagnosis not present

## 2022-01-08 DIAGNOSIS — K219 Gastro-esophageal reflux disease without esophagitis: Secondary | ICD-10-CM | POA: Diagnosis not present

## 2022-01-08 DIAGNOSIS — E7800 Pure hypercholesterolemia, unspecified: Secondary | ICD-10-CM | POA: Diagnosis not present

## 2022-01-08 DIAGNOSIS — Z7983 Long term (current) use of bisphosphonates: Secondary | ICD-10-CM | POA: Diagnosis not present

## 2022-01-08 DIAGNOSIS — Z859 Personal history of malignant neoplasm, unspecified: Secondary | ICD-10-CM | POA: Diagnosis not present

## 2022-01-08 DIAGNOSIS — Z79899 Other long term (current) drug therapy: Secondary | ICD-10-CM | POA: Diagnosis not present

## 2022-01-08 DIAGNOSIS — F32A Depression, unspecified: Secondary | ICD-10-CM | POA: Diagnosis not present

## 2022-01-08 DIAGNOSIS — R339 Retention of urine, unspecified: Secondary | ICD-10-CM | POA: Diagnosis not present

## 2022-01-08 DIAGNOSIS — E119 Type 2 diabetes mellitus without complications: Secondary | ICD-10-CM | POA: Diagnosis not present

## 2022-01-08 DIAGNOSIS — Z7901 Long term (current) use of anticoagulants: Secondary | ICD-10-CM | POA: Diagnosis not present

## 2022-02-04 DIAGNOSIS — I4891 Unspecified atrial fibrillation: Secondary | ICD-10-CM | POA: Diagnosis not present

## 2022-02-04 DIAGNOSIS — R413 Other amnesia: Secondary | ICD-10-CM | POA: Diagnosis not present

## 2022-02-04 DIAGNOSIS — F419 Anxiety disorder, unspecified: Secondary | ICD-10-CM | POA: Diagnosis not present

## 2022-02-04 DIAGNOSIS — R339 Retention of urine, unspecified: Secondary | ICD-10-CM | POA: Diagnosis not present

## 2022-02-04 DIAGNOSIS — D649 Anemia, unspecified: Secondary | ICD-10-CM | POA: Diagnosis not present

## 2022-02-11 DIAGNOSIS — R339 Retention of urine, unspecified: Secondary | ICD-10-CM | POA: Diagnosis not present

## 2022-02-12 DIAGNOSIS — I480 Paroxysmal atrial fibrillation: Secondary | ICD-10-CM | POA: Diagnosis not present

## 2022-02-21 DIAGNOSIS — R339 Retention of urine, unspecified: Secondary | ICD-10-CM | POA: Diagnosis not present

## 2022-02-21 DIAGNOSIS — I4891 Unspecified atrial fibrillation: Secondary | ICD-10-CM | POA: Diagnosis not present

## 2022-02-23 DIAGNOSIS — R339 Retention of urine, unspecified: Secondary | ICD-10-CM | POA: Diagnosis not present

## 2022-02-26 DIAGNOSIS — R5383 Other fatigue: Secondary | ICD-10-CM | POA: Diagnosis not present

## 2022-02-26 DIAGNOSIS — Z20822 Contact with and (suspected) exposure to covid-19: Secondary | ICD-10-CM | POA: Diagnosis not present

## 2022-02-26 DIAGNOSIS — B965 Pseudomonas (aeruginosa) (mallei) (pseudomallei) as the cause of diseases classified elsewhere: Secondary | ICD-10-CM | POA: Diagnosis not present

## 2022-02-26 DIAGNOSIS — J90 Pleural effusion, not elsewhere classified: Secondary | ICD-10-CM | POA: Diagnosis not present

## 2022-02-26 DIAGNOSIS — R109 Unspecified abdominal pain: Secondary | ICD-10-CM | POA: Diagnosis not present

## 2022-02-26 DIAGNOSIS — R8271 Bacteriuria: Secondary | ICD-10-CM | POA: Diagnosis not present

## 2022-02-26 DIAGNOSIS — R531 Weakness: Secondary | ICD-10-CM | POA: Diagnosis not present

## 2022-02-26 DIAGNOSIS — R4181 Age-related cognitive decline: Secondary | ICD-10-CM | POA: Diagnosis not present

## 2022-02-26 DIAGNOSIS — D509 Iron deficiency anemia, unspecified: Secondary | ICD-10-CM | POA: Diagnosis not present

## 2022-02-26 DIAGNOSIS — N3000 Acute cystitis without hematuria: Secondary | ICD-10-CM | POA: Diagnosis not present

## 2022-02-26 DIAGNOSIS — Z609 Problem related to social environment, unspecified: Secondary | ICD-10-CM | POA: Diagnosis not present

## 2022-02-26 DIAGNOSIS — R5381 Other malaise: Secondary | ICD-10-CM | POA: Diagnosis not present

## 2022-02-26 DIAGNOSIS — R001 Bradycardia, unspecified: Secondary | ICD-10-CM | POA: Diagnosis not present

## 2022-02-26 DIAGNOSIS — R110 Nausea: Secondary | ICD-10-CM | POA: Diagnosis not present

## 2022-02-26 DIAGNOSIS — F03918 Unspecified dementia, unspecified severity, with other behavioral disturbance: Secondary | ICD-10-CM | POA: Diagnosis not present

## 2022-02-26 DIAGNOSIS — R339 Retention of urine, unspecified: Secondary | ICD-10-CM | POA: Diagnosis not present

## 2022-02-26 DIAGNOSIS — R630 Anorexia: Secondary | ICD-10-CM | POA: Diagnosis not present

## 2022-02-26 DIAGNOSIS — R4182 Altered mental status, unspecified: Secondary | ICD-10-CM | POA: Diagnosis not present

## 2022-02-26 DIAGNOSIS — R509 Fever, unspecified: Secondary | ICD-10-CM | POA: Diagnosis not present

## 2022-02-26 DIAGNOSIS — K449 Diaphragmatic hernia without obstruction or gangrene: Secondary | ICD-10-CM | POA: Diagnosis not present

## 2022-02-26 DIAGNOSIS — E7800 Pure hypercholesterolemia, unspecified: Secondary | ICD-10-CM | POA: Diagnosis not present

## 2022-02-26 DIAGNOSIS — N390 Urinary tract infection, site not specified: Secondary | ICD-10-CM | POA: Diagnosis not present

## 2022-02-26 DIAGNOSIS — R54 Age-related physical debility: Secondary | ICD-10-CM | POA: Diagnosis not present

## 2022-02-26 DIAGNOSIS — I4891 Unspecified atrial fibrillation: Secondary | ICD-10-CM | POA: Diagnosis not present

## 2022-02-26 DIAGNOSIS — R0602 Shortness of breath: Secondary | ICD-10-CM | POA: Diagnosis not present

## 2022-02-26 DIAGNOSIS — E119 Type 2 diabetes mellitus without complications: Secondary | ICD-10-CM | POA: Diagnosis not present

## 2022-02-26 DIAGNOSIS — F0390 Unspecified dementia without behavioral disturbance: Secondary | ICD-10-CM | POA: Diagnosis not present

## 2022-02-27 DIAGNOSIS — R531 Weakness: Secondary | ICD-10-CM | POA: Diagnosis not present

## 2022-02-27 DIAGNOSIS — R0602 Shortness of breath: Secondary | ICD-10-CM | POA: Diagnosis not present

## 2022-02-27 DIAGNOSIS — F0390 Unspecified dementia without behavioral disturbance: Secondary | ICD-10-CM | POA: Diagnosis not present

## 2022-02-27 DIAGNOSIS — R001 Bradycardia, unspecified: Secondary | ICD-10-CM | POA: Diagnosis not present

## 2022-02-28 DIAGNOSIS — R4181 Age-related cognitive decline: Secondary | ICD-10-CM | POA: Diagnosis not present

## 2022-02-28 DIAGNOSIS — Z609 Problem related to social environment, unspecified: Secondary | ICD-10-CM | POA: Diagnosis not present

## 2022-02-28 DIAGNOSIS — R531 Weakness: Secondary | ICD-10-CM | POA: Diagnosis not present

## 2022-02-28 DIAGNOSIS — R339 Retention of urine, unspecified: Secondary | ICD-10-CM | POA: Diagnosis not present

## 2022-02-28 DIAGNOSIS — R001 Bradycardia, unspecified: Secondary | ICD-10-CM | POA: Diagnosis not present

## 2022-02-28 DIAGNOSIS — R8271 Bacteriuria: Secondary | ICD-10-CM | POA: Diagnosis not present

## 2022-03-01 DIAGNOSIS — R339 Retention of urine, unspecified: Secondary | ICD-10-CM | POA: Diagnosis not present

## 2022-03-01 DIAGNOSIS — B965 Pseudomonas (aeruginosa) (mallei) (pseudomallei) as the cause of diseases classified elsewhere: Secondary | ICD-10-CM | POA: Diagnosis not present

## 2022-03-01 DIAGNOSIS — E7800 Pure hypercholesterolemia, unspecified: Secondary | ICD-10-CM | POA: Diagnosis not present

## 2022-03-01 DIAGNOSIS — R001 Bradycardia, unspecified: Secondary | ICD-10-CM | POA: Diagnosis not present

## 2022-03-01 DIAGNOSIS — N390 Urinary tract infection, site not specified: Secondary | ICD-10-CM | POA: Diagnosis not present

## 2022-03-01 DIAGNOSIS — E119 Type 2 diabetes mellitus without complications: Secondary | ICD-10-CM | POA: Diagnosis not present

## 2022-03-01 DIAGNOSIS — R54 Age-related physical debility: Secondary | ICD-10-CM | POA: Diagnosis not present

## 2022-03-01 DIAGNOSIS — F0390 Unspecified dementia without behavioral disturbance: Secondary | ICD-10-CM | POA: Diagnosis not present

## 2022-03-01 DIAGNOSIS — D509 Iron deficiency anemia, unspecified: Secondary | ICD-10-CM | POA: Diagnosis not present

## 2022-03-01 DIAGNOSIS — I4891 Unspecified atrial fibrillation: Secondary | ICD-10-CM | POA: Diagnosis not present

## 2022-03-11 DIAGNOSIS — R0781 Pleurodynia: Secondary | ICD-10-CM | POA: Diagnosis not present

## 2022-03-11 DIAGNOSIS — R339 Retention of urine, unspecified: Secondary | ICD-10-CM | POA: Diagnosis not present

## 2022-03-11 DIAGNOSIS — R0602 Shortness of breath: Secondary | ICD-10-CM | POA: Diagnosis not present

## 2022-03-11 DIAGNOSIS — R079 Chest pain, unspecified: Secondary | ICD-10-CM | POA: Diagnosis not present

## 2022-03-11 DIAGNOSIS — Z7901 Long term (current) use of anticoagulants: Secondary | ICD-10-CM | POA: Diagnosis not present

## 2022-03-11 DIAGNOSIS — I4891 Unspecified atrial fibrillation: Secondary | ICD-10-CM | POA: Diagnosis not present

## 2022-03-11 DIAGNOSIS — R0689 Other abnormalities of breathing: Secondary | ICD-10-CM | POA: Diagnosis not present

## 2022-03-11 DIAGNOSIS — E119 Type 2 diabetes mellitus without complications: Secondary | ICD-10-CM | POA: Diagnosis not present

## 2022-03-11 DIAGNOSIS — R071 Chest pain on breathing: Secondary | ICD-10-CM | POA: Diagnosis not present

## 2022-03-11 DIAGNOSIS — E7800 Pure hypercholesterolemia, unspecified: Secondary | ICD-10-CM | POA: Diagnosis not present

## 2022-03-11 DIAGNOSIS — R9341 Abnormal radiologic findings on diagnostic imaging of renal pelvis, ureter, or bladder: Secondary | ICD-10-CM | POA: Diagnosis not present

## 2022-03-11 DIAGNOSIS — M1990 Unspecified osteoarthritis, unspecified site: Secondary | ICD-10-CM | POA: Diagnosis not present

## 2022-03-11 DIAGNOSIS — R109 Unspecified abdominal pain: Secondary | ICD-10-CM | POA: Diagnosis not present

## 2022-03-12 DIAGNOSIS — R079 Chest pain, unspecified: Secondary | ICD-10-CM | POA: Diagnosis not present

## 2022-03-19 DIAGNOSIS — I480 Paroxysmal atrial fibrillation: Secondary | ICD-10-CM | POA: Diagnosis not present

## 2022-03-19 DIAGNOSIS — I5030 Unspecified diastolic (congestive) heart failure: Secondary | ICD-10-CM | POA: Diagnosis not present

## 2022-03-19 DIAGNOSIS — F0394 Unspecified dementia, unspecified severity, with anxiety: Secondary | ICD-10-CM | POA: Diagnosis not present

## 2022-03-19 DIAGNOSIS — F32A Depression, unspecified: Secondary | ICD-10-CM | POA: Diagnosis not present

## 2022-03-19 DIAGNOSIS — N390 Urinary tract infection, site not specified: Secondary | ICD-10-CM | POA: Diagnosis not present

## 2022-03-19 DIAGNOSIS — F0393 Unspecified dementia, unspecified severity, with mood disturbance: Secondary | ICD-10-CM | POA: Diagnosis not present

## 2022-03-19 DIAGNOSIS — E119 Type 2 diabetes mellitus without complications: Secondary | ICD-10-CM | POA: Diagnosis not present

## 2022-03-26 DIAGNOSIS — Z7901 Long term (current) use of anticoagulants: Secondary | ICD-10-CM | POA: Diagnosis not present

## 2022-03-26 DIAGNOSIS — Z7983 Long term (current) use of bisphosphonates: Secondary | ICD-10-CM | POA: Diagnosis not present

## 2022-03-26 DIAGNOSIS — K219 Gastro-esophageal reflux disease without esophagitis: Secondary | ICD-10-CM | POA: Diagnosis not present

## 2022-03-26 DIAGNOSIS — E785 Hyperlipidemia, unspecified: Secondary | ICD-10-CM | POA: Diagnosis not present

## 2022-03-26 DIAGNOSIS — E1169 Type 2 diabetes mellitus with other specified complication: Secondary | ICD-10-CM | POA: Diagnosis not present

## 2022-03-26 DIAGNOSIS — R339 Retention of urine, unspecified: Secondary | ICD-10-CM | POA: Diagnosis not present

## 2022-03-26 DIAGNOSIS — F32A Depression, unspecified: Secondary | ICD-10-CM | POA: Diagnosis not present

## 2022-03-26 DIAGNOSIS — Z7984 Long term (current) use of oral hypoglycemic drugs: Secondary | ICD-10-CM | POA: Diagnosis not present

## 2022-03-26 DIAGNOSIS — Z885 Allergy status to narcotic agent status: Secondary | ICD-10-CM | POA: Diagnosis not present

## 2022-04-06 NOTE — Addendum Note (Signed)
Encounter addended by: Annie Paras on: 04/06/2022 9:54 AM  Actions taken: Letter saved

## 2022-05-05 IMAGING — CT CT CHEST W/O CM
2 of 4 series · 15 of 36 positions shown, 18 images · non-contrast
Comparison: 07/08/2016

CLINICAL DATA: Follow-up lung nodule. History of invasive ductal
carcinoma of the left breast.

EXAM:
CT CHEST WITHOUT CONTRAST
TECHNIQUE: Multidetector CT imaging of the chest was performed following the
standard protocol without IV contrast.

[Series 2: thorax · axial · 0.61mm/px · z∈[-692,-416]mm · 12 of 162 slices shown, 15 images]
[im 12/162  mediastinal]
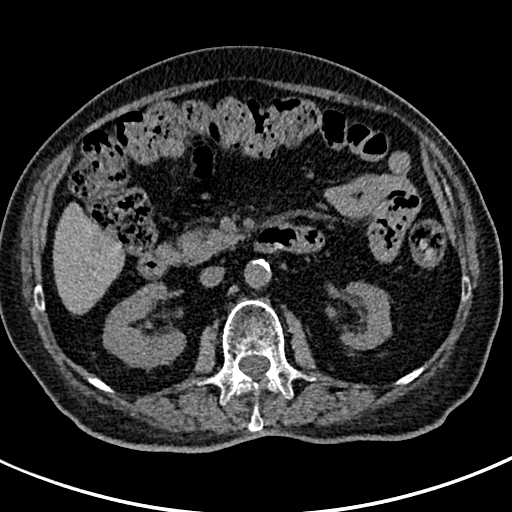
[im 12/162  lung]
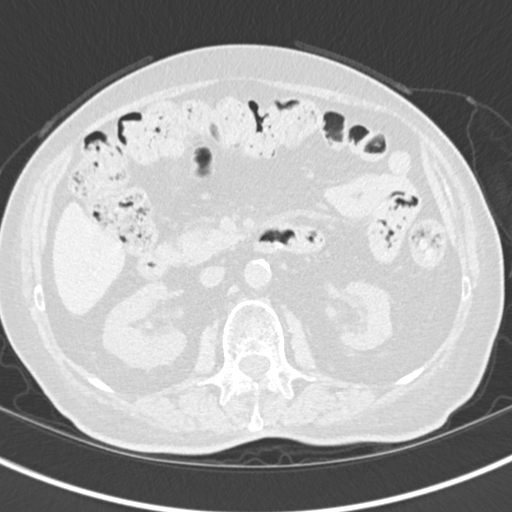
[im 24/162  lung]
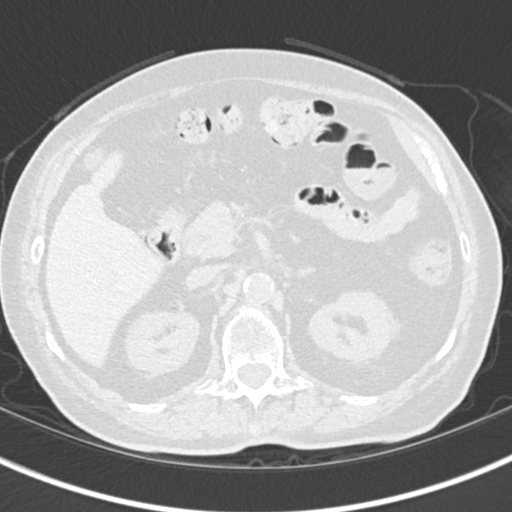
[im 35/162  lung]
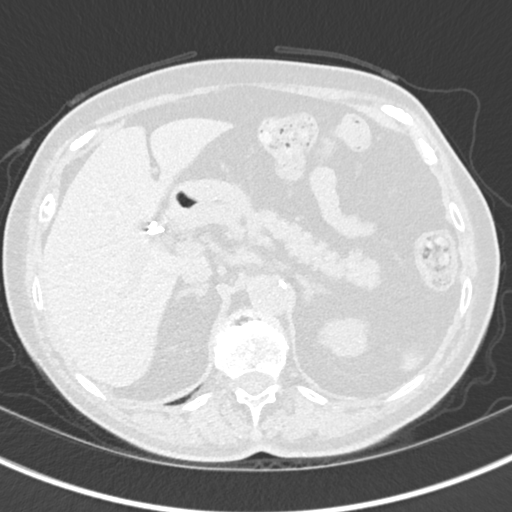
[im 47/162  lung]
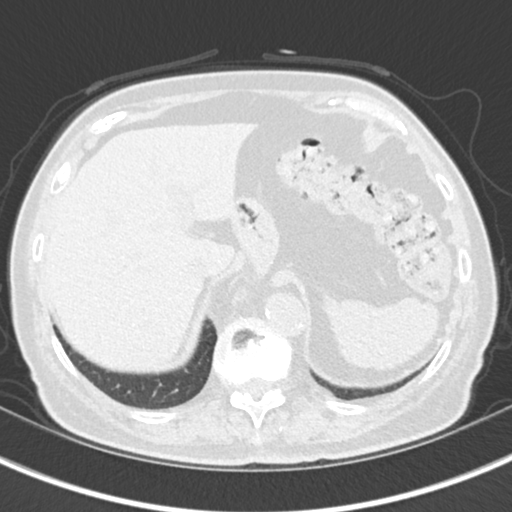
[im 58/162  mediastinal]
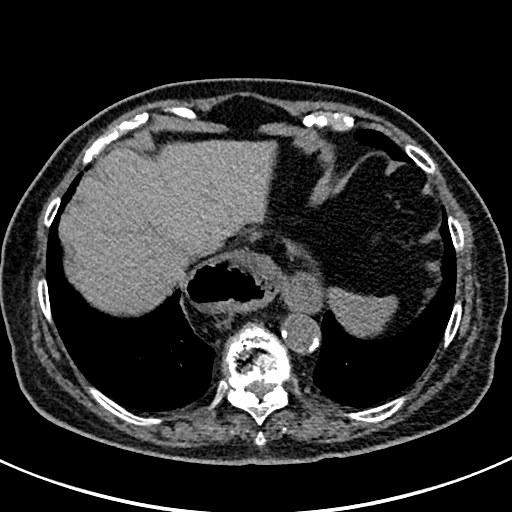
[im 58/162  lung]
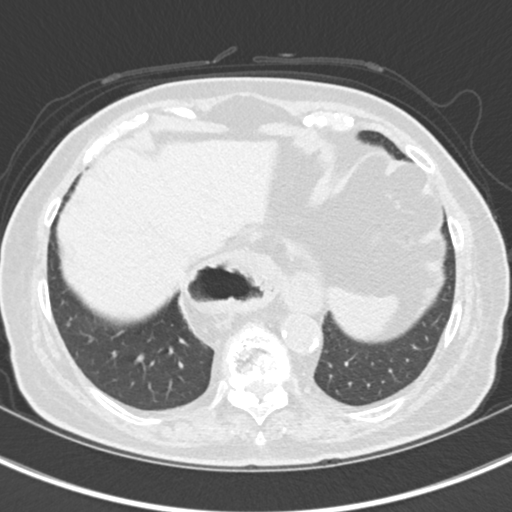
[im 70/162  lung]
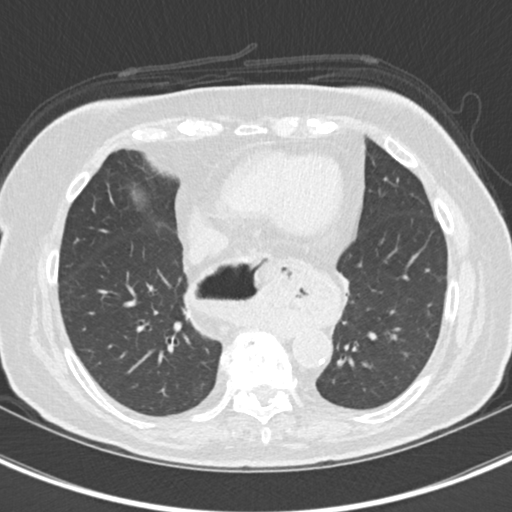
[im 93/162  lung]
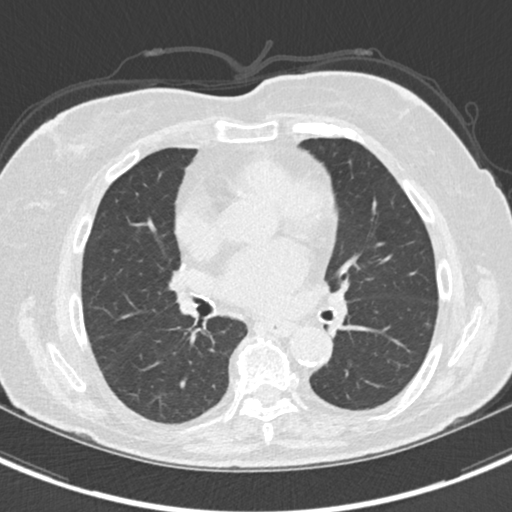
[im 104/162  lung]
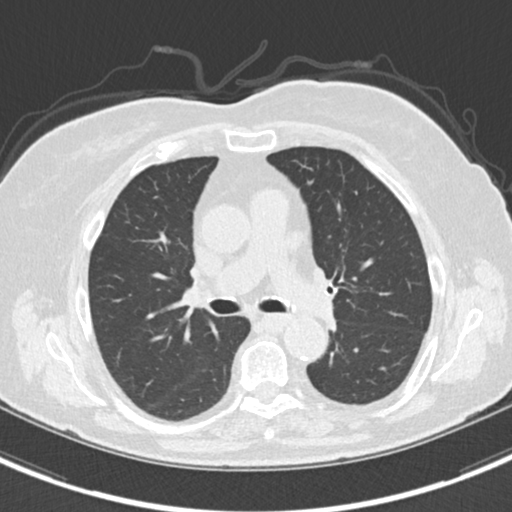
[im 116/162  mediastinal]
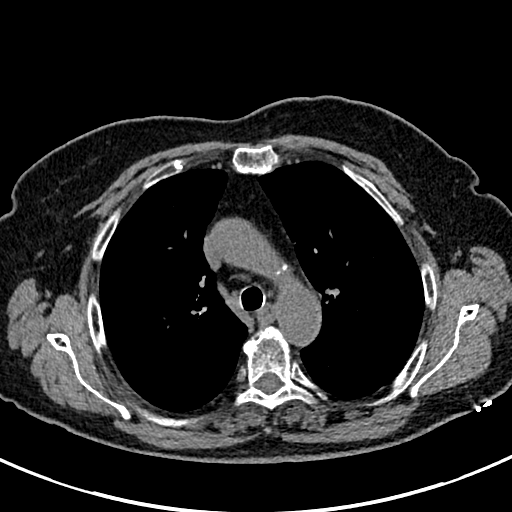
[im 116/162  lung]
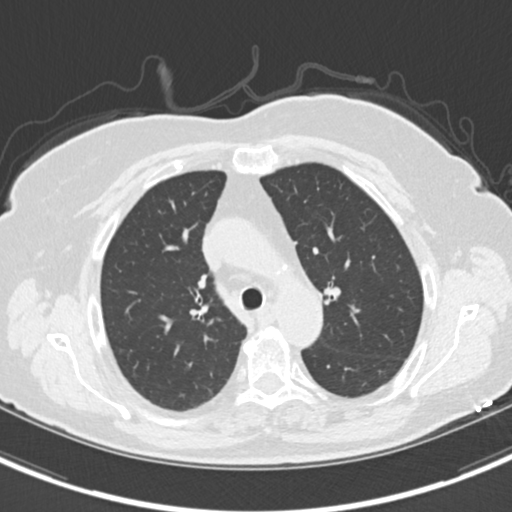
[im 127/162  lung]
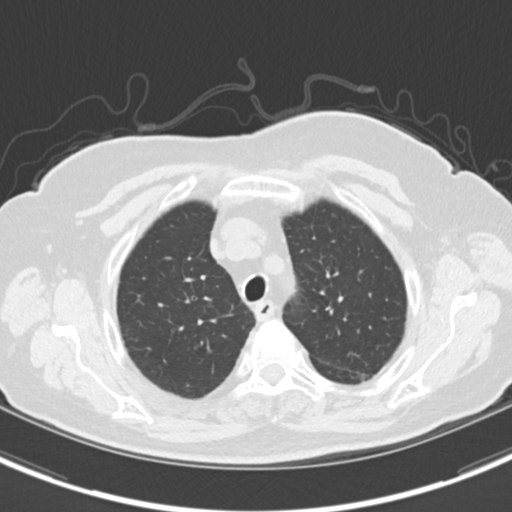
[im 139/162  lung]
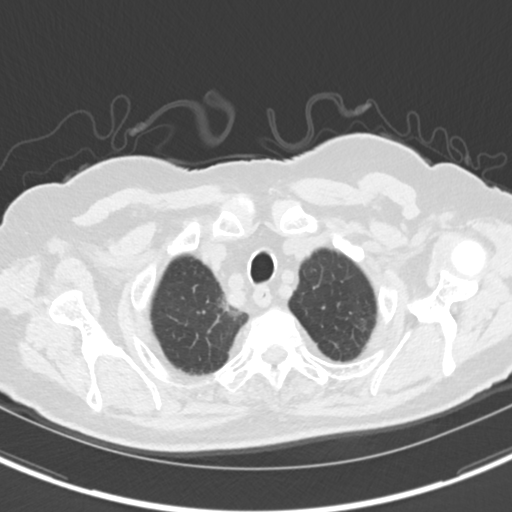
[im 150/162  lung]
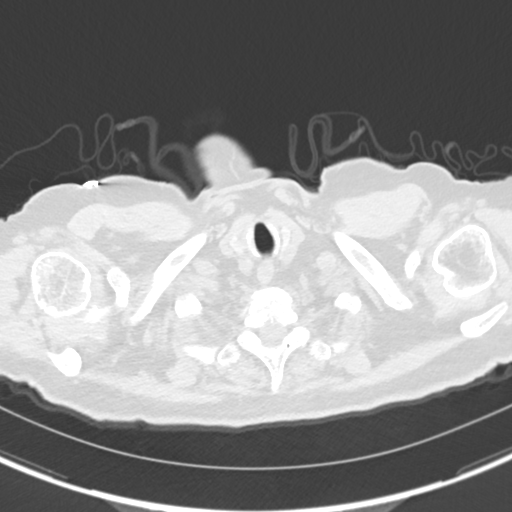

[Series 5: coronal · coronal · 0.65mm/px · 3 of 131 slices shown]
[im 27/131  lung]
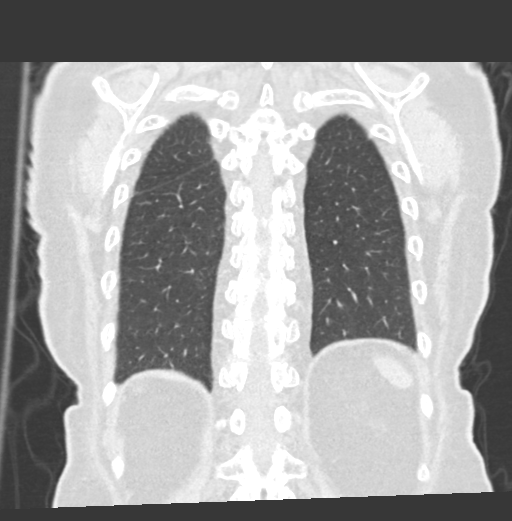
[im 53/131  lung]
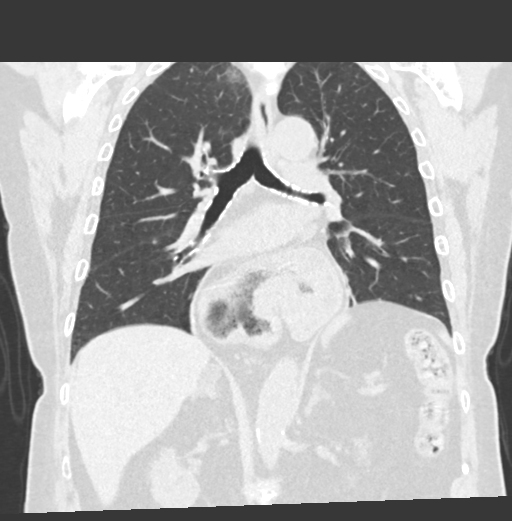
[im 79/131  lung]
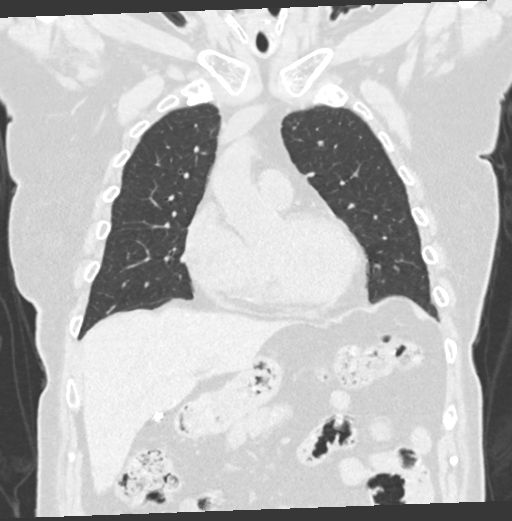

[15 of 36 positions shown; findings below may reference images not displayed]

FINDINGS: Cardiovascular: Heart size appears within normal limits. No
pericardial effusion. Aortic atherosclerosis. Coronary artery
calcifications.

Mediastinum/Nodes: Normal appearance of the thyroid gland. The
trachea appears patent and is midline. Normal appearance of the
esophagus. Moderate size hiatal hernia.

No enlarged lymph nodes.

Lungs/Pleura: No pleural effusion, airspace consolidation or
atelectasis. Non-solid nodule in the superior segment of left lower
lobe measures 1.6 x 1.1 cm, image 41/4. Formally 0.9 x 0.8 cm. Solid
nodule within the right upper lobe measures 4 mm, image [DATE].
Previously this measured the same.

Upper Abdomen: No acute abnormality within the imaged portions of
the upper abdomen. Right adrenal gland adenoma measures 1.9 cm,
image 121/2. Previous cholecystectomy.

Musculoskeletal: No acute or suspicious osseous findings.
Degenerative disc disease identified within the thoracic spine.
IMPRESSION: 1. Interval increase in size of non-solid nodule within the superior
segment of left lower lobe. Adenocarcinoma cannot be excluded.
Follow up by CT is recommended in 12 months, with continued annual
surveillance for a minimum of 3 years. These recommendations are
taken from: Recommendations for the Management of Subsolid Pulmonary
Nodules Detected at CT: A Statement from the [HOSPITAL]
2. Stable appearance of 4 mm right upper lobe solid nodule.
3. Aortic atherosclerosis and coronary artery calcifications.
4. Right adrenal gland adenoma.

Aortic Atherosclerosis ([id number]-Y4B.B).

## 2022-05-28 DIAGNOSIS — M546 Pain in thoracic spine: Secondary | ICD-10-CM | POA: Diagnosis not present

## 2022-05-28 DIAGNOSIS — M810 Age-related osteoporosis without current pathological fracture: Secondary | ICD-10-CM | POA: Diagnosis not present

## 2022-05-28 DIAGNOSIS — K219 Gastro-esophageal reflux disease without esophagitis: Secondary | ICD-10-CM | POA: Diagnosis not present

## 2022-05-28 DIAGNOSIS — E7800 Pure hypercholesterolemia, unspecified: Secondary | ICD-10-CM | POA: Diagnosis not present

## 2022-05-28 DIAGNOSIS — I4891 Unspecified atrial fibrillation: Secondary | ICD-10-CM | POA: Diagnosis not present

## 2022-05-28 DIAGNOSIS — Z1331 Encounter for screening for depression: Secondary | ICD-10-CM | POA: Diagnosis not present

## 2022-05-28 DIAGNOSIS — Z0000 Encounter for general adult medical examination without abnormal findings: Secondary | ICD-10-CM | POA: Diagnosis not present

## 2022-05-28 DIAGNOSIS — E119 Type 2 diabetes mellitus without complications: Secondary | ICD-10-CM | POA: Diagnosis not present

## 2022-05-28 DIAGNOSIS — F419 Anxiety disorder, unspecified: Secondary | ICD-10-CM | POA: Diagnosis not present

## 2022-06-09 DIAGNOSIS — F05 Delirium due to known physiological condition: Secondary | ICD-10-CM | POA: Diagnosis not present

## 2022-06-09 DIAGNOSIS — R339 Retention of urine, unspecified: Secondary | ICD-10-CM | POA: Diagnosis not present

## 2022-06-09 DIAGNOSIS — E7800 Pure hypercholesterolemia, unspecified: Secondary | ICD-10-CM | POA: Diagnosis not present

## 2022-06-09 DIAGNOSIS — R000 Tachycardia, unspecified: Secondary | ICD-10-CM | POA: Diagnosis not present

## 2022-06-09 DIAGNOSIS — K219 Gastro-esophageal reflux disease without esophagitis: Secondary | ICD-10-CM | POA: Diagnosis not present

## 2022-06-09 DIAGNOSIS — Z87448 Personal history of other diseases of urinary system: Secondary | ICD-10-CM | POA: Diagnosis not present

## 2022-06-09 DIAGNOSIS — R7402 Elevation of levels of lactic acid dehydrogenase (LDH): Secondary | ICD-10-CM | POA: Diagnosis not present

## 2022-06-09 DIAGNOSIS — R1011 Right upper quadrant pain: Secondary | ICD-10-CM | POA: Diagnosis not present

## 2022-06-09 DIAGNOSIS — F32A Depression, unspecified: Secondary | ICD-10-CM | POA: Diagnosis not present

## 2022-06-09 DIAGNOSIS — E119 Type 2 diabetes mellitus without complications: Secondary | ICD-10-CM | POA: Diagnosis not present

## 2022-06-09 DIAGNOSIS — I4892 Unspecified atrial flutter: Secondary | ICD-10-CM | POA: Diagnosis not present

## 2022-06-09 DIAGNOSIS — Z7901 Long term (current) use of anticoagulants: Secondary | ICD-10-CM | POA: Diagnosis not present

## 2022-06-09 DIAGNOSIS — R1032 Left lower quadrant pain: Secondary | ICD-10-CM | POA: Diagnosis not present

## 2022-06-09 DIAGNOSIS — M810 Age-related osteoporosis without current pathological fracture: Secondary | ICD-10-CM | POA: Diagnosis not present

## 2022-06-09 DIAGNOSIS — R0600 Dyspnea, unspecified: Secondary | ICD-10-CM | POA: Diagnosis not present

## 2022-06-09 DIAGNOSIS — Z79899 Other long term (current) drug therapy: Secondary | ICD-10-CM | POA: Diagnosis not present

## 2022-06-09 DIAGNOSIS — R1310 Dysphagia, unspecified: Secondary | ICD-10-CM | POA: Diagnosis not present

## 2022-06-09 DIAGNOSIS — B9689 Other specified bacterial agents as the cause of diseases classified elsewhere: Secondary | ICD-10-CM | POA: Diagnosis not present

## 2022-06-09 DIAGNOSIS — I4891 Unspecified atrial fibrillation: Secondary | ICD-10-CM | POA: Diagnosis not present

## 2022-06-09 DIAGNOSIS — R42 Dizziness and giddiness: Secondary | ICD-10-CM | POA: Diagnosis not present

## 2022-06-09 DIAGNOSIS — R1013 Epigastric pain: Secondary | ICD-10-CM | POA: Diagnosis not present

## 2022-06-09 DIAGNOSIS — E279 Disorder of adrenal gland, unspecified: Secondary | ICD-10-CM | POA: Diagnosis not present

## 2022-06-09 DIAGNOSIS — R1031 Right lower quadrant pain: Secondary | ICD-10-CM | POA: Diagnosis not present

## 2022-06-09 DIAGNOSIS — Z792 Long term (current) use of antibiotics: Secondary | ICD-10-CM | POA: Diagnosis not present

## 2022-06-09 DIAGNOSIS — F0390 Unspecified dementia without behavioral disturbance: Secondary | ICD-10-CM | POA: Diagnosis not present

## 2022-06-09 DIAGNOSIS — R410 Disorientation, unspecified: Secondary | ICD-10-CM | POA: Diagnosis not present

## 2022-06-09 DIAGNOSIS — R918 Other nonspecific abnormal finding of lung field: Secondary | ICD-10-CM | POA: Diagnosis not present

## 2022-06-09 DIAGNOSIS — R55 Syncope and collapse: Secondary | ICD-10-CM | POA: Diagnosis not present

## 2022-06-09 DIAGNOSIS — N390 Urinary tract infection, site not specified: Secondary | ICD-10-CM | POA: Diagnosis not present

## 2022-06-09 DIAGNOSIS — I480 Paroxysmal atrial fibrillation: Secondary | ICD-10-CM | POA: Diagnosis not present

## 2022-06-09 DIAGNOSIS — F419 Anxiety disorder, unspecified: Secondary | ICD-10-CM | POA: Diagnosis not present

## 2022-06-23 DIAGNOSIS — I4891 Unspecified atrial fibrillation: Secondary | ICD-10-CM | POA: Diagnosis not present

## 2022-06-23 DIAGNOSIS — F419 Anxiety disorder, unspecified: Secondary | ICD-10-CM | POA: Diagnosis not present

## 2022-06-23 DIAGNOSIS — G8929 Other chronic pain: Secondary | ICD-10-CM | POA: Diagnosis not present

## 2022-06-23 DIAGNOSIS — M810 Age-related osteoporosis without current pathological fracture: Secondary | ICD-10-CM | POA: Diagnosis not present

## 2022-06-23 DIAGNOSIS — E7800 Pure hypercholesterolemia, unspecified: Secondary | ICD-10-CM | POA: Diagnosis not present

## 2022-06-23 DIAGNOSIS — E119 Type 2 diabetes mellitus without complications: Secondary | ICD-10-CM | POA: Diagnosis not present

## 2022-06-25 DIAGNOSIS — I480 Paroxysmal atrial fibrillation: Secondary | ICD-10-CM | POA: Diagnosis not present

## 2022-06-25 DIAGNOSIS — I495 Sick sinus syndrome: Secondary | ICD-10-CM | POA: Diagnosis not present

## 2022-06-29 DIAGNOSIS — N3001 Acute cystitis with hematuria: Secondary | ICD-10-CM | POA: Diagnosis not present

## 2022-06-29 DIAGNOSIS — E119 Type 2 diabetes mellitus without complications: Secondary | ICD-10-CM | POA: Diagnosis not present

## 2022-06-29 DIAGNOSIS — Z7984 Long term (current) use of oral hypoglycemic drugs: Secondary | ICD-10-CM | POA: Diagnosis not present

## 2022-06-29 DIAGNOSIS — Z7901 Long term (current) use of anticoagulants: Secondary | ICD-10-CM | POA: Diagnosis not present

## 2022-06-29 DIAGNOSIS — Z885 Allergy status to narcotic agent status: Secondary | ICD-10-CM | POA: Diagnosis not present

## 2022-07-01 DIAGNOSIS — E119 Type 2 diabetes mellitus without complications: Secondary | ICD-10-CM | POA: Diagnosis not present

## 2022-07-01 DIAGNOSIS — Z7984 Long term (current) use of oral hypoglycemic drugs: Secondary | ICD-10-CM | POA: Diagnosis not present

## 2022-07-01 DIAGNOSIS — F32A Depression, unspecified: Secondary | ICD-10-CM | POA: Diagnosis not present

## 2022-07-01 DIAGNOSIS — N390 Urinary tract infection, site not specified: Secondary | ICD-10-CM | POA: Diagnosis not present

## 2022-07-01 DIAGNOSIS — M1990 Unspecified osteoarthritis, unspecified site: Secondary | ICD-10-CM | POA: Diagnosis not present

## 2022-07-01 DIAGNOSIS — R339 Retention of urine, unspecified: Secondary | ICD-10-CM | POA: Diagnosis not present

## 2022-07-01 DIAGNOSIS — N3000 Acute cystitis without hematuria: Secondary | ICD-10-CM | POA: Diagnosis not present

## 2022-07-01 DIAGNOSIS — E7800 Pure hypercholesterolemia, unspecified: Secondary | ICD-10-CM | POA: Diagnosis not present

## 2022-07-01 DIAGNOSIS — K219 Gastro-esophageal reflux disease without esophagitis: Secondary | ICD-10-CM | POA: Diagnosis not present

## 2022-07-01 DIAGNOSIS — I4891 Unspecified atrial fibrillation: Secondary | ICD-10-CM | POA: Diagnosis not present

## 2022-07-02 DIAGNOSIS — Z09 Encounter for follow-up examination after completed treatment for conditions other than malignant neoplasm: Secondary | ICD-10-CM | POA: Diagnosis not present

## 2022-07-02 DIAGNOSIS — R630 Anorexia: Secondary | ICD-10-CM | POA: Diagnosis not present

## 2022-07-02 DIAGNOSIS — N3001 Acute cystitis with hematuria: Secondary | ICD-10-CM | POA: Diagnosis not present

## 2022-07-02 DIAGNOSIS — R634 Abnormal weight loss: Secondary | ICD-10-CM | POA: Diagnosis not present

## 2022-07-02 DIAGNOSIS — R339 Retention of urine, unspecified: Secondary | ICD-10-CM | POA: Diagnosis not present

## 2022-07-04 DIAGNOSIS — Z20822 Contact with and (suspected) exposure to covid-19: Secondary | ICD-10-CM | POA: Diagnosis not present

## 2022-07-04 DIAGNOSIS — Z1152 Encounter for screening for COVID-19: Secondary | ICD-10-CM | POA: Diagnosis not present

## 2022-07-04 DIAGNOSIS — R2689 Other abnormalities of gait and mobility: Secondary | ICD-10-CM | POA: Diagnosis not present

## 2022-07-04 DIAGNOSIS — R5383 Other fatigue: Secondary | ICD-10-CM | POA: Diagnosis not present

## 2022-07-04 DIAGNOSIS — I440 Atrioventricular block, first degree: Secondary | ICD-10-CM | POA: Diagnosis not present

## 2022-07-04 DIAGNOSIS — R531 Weakness: Secondary | ICD-10-CM | POA: Diagnosis not present

## 2022-07-04 DIAGNOSIS — N3000 Acute cystitis without hematuria: Secondary | ICD-10-CM | POA: Diagnosis not present

## 2022-07-04 DIAGNOSIS — R4182 Altered mental status, unspecified: Secondary | ICD-10-CM | POA: Diagnosis not present

## 2022-07-04 DIAGNOSIS — I495 Sick sinus syndrome: Secondary | ICD-10-CM | POA: Diagnosis not present

## 2022-07-04 DIAGNOSIS — B965 Pseudomonas (aeruginosa) (mallei) (pseudomallei) as the cause of diseases classified elsewhere: Secondary | ICD-10-CM | POA: Diagnosis not present

## 2022-07-04 DIAGNOSIS — F0390 Unspecified dementia without behavioral disturbance: Secondary | ICD-10-CM | POA: Diagnosis not present

## 2022-07-04 DIAGNOSIS — E119 Type 2 diabetes mellitus without complications: Secondary | ICD-10-CM | POA: Diagnosis not present

## 2022-07-04 DIAGNOSIS — R001 Bradycardia, unspecified: Secondary | ICD-10-CM | POA: Diagnosis not present

## 2022-07-04 DIAGNOSIS — I480 Paroxysmal atrial fibrillation: Secondary | ICD-10-CM | POA: Diagnosis not present

## 2022-07-05 DIAGNOSIS — E119 Type 2 diabetes mellitus without complications: Secondary | ICD-10-CM | POA: Diagnosis not present

## 2022-07-05 DIAGNOSIS — F0390 Unspecified dementia without behavioral disturbance: Secondary | ICD-10-CM | POA: Diagnosis not present

## 2022-07-05 DIAGNOSIS — R531 Weakness: Secondary | ICD-10-CM | POA: Diagnosis not present

## 2022-07-05 DIAGNOSIS — F419 Anxiety disorder, unspecified: Secondary | ICD-10-CM | POA: Diagnosis not present

## 2022-07-05 DIAGNOSIS — R001 Bradycardia, unspecified: Secondary | ICD-10-CM | POA: Diagnosis not present

## 2022-07-05 DIAGNOSIS — I480 Paroxysmal atrial fibrillation: Secondary | ICD-10-CM | POA: Diagnosis not present

## 2022-07-05 DIAGNOSIS — K219 Gastro-esophageal reflux disease without esophagitis: Secondary | ICD-10-CM | POA: Diagnosis not present

## 2022-07-05 DIAGNOSIS — M810 Age-related osteoporosis without current pathological fracture: Secondary | ICD-10-CM | POA: Diagnosis not present

## 2022-07-06 DIAGNOSIS — E119 Type 2 diabetes mellitus without complications: Secondary | ICD-10-CM | POA: Diagnosis not present

## 2022-07-06 DIAGNOSIS — R531 Weakness: Secondary | ICD-10-CM | POA: Diagnosis not present

## 2022-07-06 DIAGNOSIS — I480 Paroxysmal atrial fibrillation: Secondary | ICD-10-CM | POA: Diagnosis not present

## 2022-07-06 DIAGNOSIS — M810 Age-related osteoporosis without current pathological fracture: Secondary | ICD-10-CM | POA: Diagnosis not present

## 2022-07-06 DIAGNOSIS — K219 Gastro-esophageal reflux disease without esophagitis: Secondary | ICD-10-CM | POA: Diagnosis not present

## 2022-07-07 DIAGNOSIS — E119 Type 2 diabetes mellitus without complications: Secondary | ICD-10-CM | POA: Diagnosis not present

## 2022-07-07 DIAGNOSIS — E7800 Pure hypercholesterolemia, unspecified: Secondary | ICD-10-CM | POA: Diagnosis not present

## 2022-07-07 DIAGNOSIS — M810 Age-related osteoporosis without current pathological fracture: Secondary | ICD-10-CM | POA: Diagnosis not present

## 2022-07-07 DIAGNOSIS — F419 Anxiety disorder, unspecified: Secondary | ICD-10-CM | POA: Diagnosis not present

## 2022-07-07 DIAGNOSIS — I4891 Unspecified atrial fibrillation: Secondary | ICD-10-CM | POA: Diagnosis not present

## 2022-07-15 DIAGNOSIS — G8929 Other chronic pain: Secondary | ICD-10-CM | POA: Diagnosis not present

## 2022-07-15 DIAGNOSIS — F0394 Unspecified dementia, unspecified severity, with anxiety: Secondary | ICD-10-CM | POA: Diagnosis not present

## 2022-07-15 DIAGNOSIS — I4891 Unspecified atrial fibrillation: Secondary | ICD-10-CM | POA: Diagnosis not present

## 2022-07-15 DIAGNOSIS — I5030 Unspecified diastolic (congestive) heart failure: Secondary | ICD-10-CM | POA: Diagnosis not present

## 2022-07-15 DIAGNOSIS — I110 Hypertensive heart disease with heart failure: Secondary | ICD-10-CM | POA: Diagnosis not present

## 2022-07-15 DIAGNOSIS — E119 Type 2 diabetes mellitus without complications: Secondary | ICD-10-CM | POA: Diagnosis not present

## 2022-07-15 DIAGNOSIS — F32A Depression, unspecified: Secondary | ICD-10-CM | POA: Diagnosis not present

## 2022-07-15 DIAGNOSIS — M1990 Unspecified osteoarthritis, unspecified site: Secondary | ICD-10-CM | POA: Diagnosis not present

## 2022-07-15 DIAGNOSIS — I4892 Unspecified atrial flutter: Secondary | ICD-10-CM | POA: Diagnosis not present

## 2022-07-16 DIAGNOSIS — R630 Anorexia: Secondary | ICD-10-CM | POA: Diagnosis not present

## 2022-07-16 DIAGNOSIS — K219 Gastro-esophageal reflux disease without esophagitis: Secondary | ICD-10-CM | POA: Diagnosis not present

## 2022-07-16 DIAGNOSIS — R1310 Dysphagia, unspecified: Secondary | ICD-10-CM | POA: Diagnosis not present

## 2022-07-16 DIAGNOSIS — F0390 Unspecified dementia without behavioral disturbance: Secondary | ICD-10-CM | POA: Diagnosis not present

## 2022-07-16 DIAGNOSIS — R5381 Other malaise: Secondary | ICD-10-CM | POA: Diagnosis not present

## 2022-07-16 DIAGNOSIS — E119 Type 2 diabetes mellitus without complications: Secondary | ICD-10-CM | POA: Diagnosis not present

## 2022-07-16 DIAGNOSIS — Z885 Allergy status to narcotic agent status: Secondary | ICD-10-CM | POA: Diagnosis not present

## 2022-07-16 DIAGNOSIS — Z853 Personal history of malignant neoplasm of breast: Secondary | ICD-10-CM | POA: Diagnosis not present

## 2022-07-16 DIAGNOSIS — M545 Low back pain, unspecified: Secondary | ICD-10-CM | POA: Diagnosis not present

## 2022-07-16 DIAGNOSIS — Z7901 Long term (current) use of anticoagulants: Secondary | ICD-10-CM | POA: Diagnosis not present

## 2022-07-16 DIAGNOSIS — I4891 Unspecified atrial fibrillation: Secondary | ICD-10-CM | POA: Diagnosis not present

## 2022-07-16 DIAGNOSIS — R1110 Vomiting, unspecified: Secondary | ICD-10-CM | POA: Diagnosis not present

## 2022-07-16 DIAGNOSIS — R001 Bradycardia, unspecified: Secondary | ICD-10-CM | POA: Diagnosis not present

## 2022-07-16 DIAGNOSIS — E7800 Pure hypercholesterolemia, unspecified: Secondary | ICD-10-CM | POA: Diagnosis not present

## 2022-07-22 DIAGNOSIS — I4891 Unspecified atrial fibrillation: Secondary | ICD-10-CM | POA: Diagnosis not present

## 2022-08-27 DIAGNOSIS — E7800 Pure hypercholesterolemia, unspecified: Secondary | ICD-10-CM | POA: Diagnosis not present

## 2022-08-27 DIAGNOSIS — F32A Depression, unspecified: Secondary | ICD-10-CM | POA: Diagnosis not present

## 2022-08-27 DIAGNOSIS — Z7901 Long term (current) use of anticoagulants: Secondary | ICD-10-CM | POA: Diagnosis not present

## 2022-08-27 DIAGNOSIS — M1990 Unspecified osteoarthritis, unspecified site: Secondary | ICD-10-CM | POA: Diagnosis not present

## 2022-08-27 DIAGNOSIS — R339 Retention of urine, unspecified: Secondary | ICD-10-CM | POA: Diagnosis not present

## 2022-08-27 DIAGNOSIS — R3914 Feeling of incomplete bladder emptying: Secondary | ICD-10-CM | POA: Diagnosis not present

## 2022-08-27 DIAGNOSIS — E119 Type 2 diabetes mellitus without complications: Secondary | ICD-10-CM | POA: Diagnosis not present

## 2022-08-27 DIAGNOSIS — K219 Gastro-esophageal reflux disease without esophagitis: Secondary | ICD-10-CM | POA: Diagnosis not present

## 2022-08-27 DIAGNOSIS — Z79899 Other long term (current) drug therapy: Secondary | ICD-10-CM | POA: Diagnosis not present

## 2022-09-23 DIAGNOSIS — M545 Low back pain, unspecified: Secondary | ICD-10-CM | POA: Diagnosis not present

## 2022-09-23 DIAGNOSIS — E119 Type 2 diabetes mellitus without complications: Secondary | ICD-10-CM | POA: Diagnosis not present

## 2022-09-23 DIAGNOSIS — Z7984 Long term (current) use of oral hypoglycemic drugs: Secondary | ICD-10-CM | POA: Diagnosis not present

## 2022-09-23 DIAGNOSIS — G8929 Other chronic pain: Secondary | ICD-10-CM | POA: Diagnosis not present

## 2022-09-29 DIAGNOSIS — G8929 Other chronic pain: Secondary | ICD-10-CM | POA: Diagnosis not present

## 2022-09-29 DIAGNOSIS — R339 Retention of urine, unspecified: Secondary | ICD-10-CM | POA: Diagnosis not present

## 2022-09-29 DIAGNOSIS — Z20822 Contact with and (suspected) exposure to covid-19: Secondary | ICD-10-CM | POA: Diagnosis not present

## 2022-09-29 DIAGNOSIS — E785 Hyperlipidemia, unspecified: Secondary | ICD-10-CM | POA: Diagnosis not present

## 2022-09-29 DIAGNOSIS — M545 Low back pain, unspecified: Secondary | ICD-10-CM | POA: Diagnosis not present

## 2022-09-29 DIAGNOSIS — R399 Unspecified symptoms and signs involving the genitourinary system: Secondary | ICD-10-CM | POA: Diagnosis not present

## 2022-09-29 DIAGNOSIS — R634 Abnormal weight loss: Secondary | ICD-10-CM | POA: Diagnosis not present

## 2022-09-29 DIAGNOSIS — E119 Type 2 diabetes mellitus without complications: Secondary | ICD-10-CM | POA: Diagnosis not present

## 2022-10-22 DIAGNOSIS — I480 Paroxysmal atrial fibrillation: Secondary | ICD-10-CM | POA: Diagnosis not present

## 2022-10-22 DIAGNOSIS — E785 Hyperlipidemia, unspecified: Secondary | ICD-10-CM | POA: Diagnosis not present

## 2022-10-23 DIAGNOSIS — I4891 Unspecified atrial fibrillation: Secondary | ICD-10-CM | POA: Diagnosis not present

## 2022-10-23 DIAGNOSIS — E7800 Pure hypercholesterolemia, unspecified: Secondary | ICD-10-CM | POA: Diagnosis not present

## 2022-10-23 DIAGNOSIS — E119 Type 2 diabetes mellitus without complications: Secondary | ICD-10-CM | POA: Diagnosis not present

## 2022-10-23 DIAGNOSIS — M810 Age-related osteoporosis without current pathological fracture: Secondary | ICD-10-CM | POA: Diagnosis not present

## 2022-10-29 DIAGNOSIS — E782 Mixed hyperlipidemia: Secondary | ICD-10-CM | POA: Diagnosis not present

## 2022-10-29 DIAGNOSIS — M545 Low back pain, unspecified: Secondary | ICD-10-CM | POA: Diagnosis not present

## 2022-10-29 DIAGNOSIS — R2689 Other abnormalities of gait and mobility: Secondary | ICD-10-CM | POA: Diagnosis not present

## 2022-10-29 DIAGNOSIS — G8929 Other chronic pain: Secondary | ICD-10-CM | POA: Diagnosis not present

## 2022-10-29 DIAGNOSIS — R413 Other amnesia: Secondary | ICD-10-CM | POA: Diagnosis not present

## 2022-10-29 DIAGNOSIS — I4891 Unspecified atrial fibrillation: Secondary | ICD-10-CM | POA: Diagnosis not present

## 2022-10-29 DIAGNOSIS — Z1330 Encounter for screening examination for mental health and behavioral disorders, unspecified: Secondary | ICD-10-CM | POA: Diagnosis not present

## 2022-10-29 DIAGNOSIS — R54 Age-related physical debility: Secondary | ICD-10-CM | POA: Diagnosis not present

## 2022-10-29 DIAGNOSIS — Z1331 Encounter for screening for depression: Secondary | ICD-10-CM | POA: Diagnosis not present

## 2022-10-29 DIAGNOSIS — I480 Paroxysmal atrial fibrillation: Secondary | ICD-10-CM | POA: Diagnosis not present

## 2022-11-03 DIAGNOSIS — F0393 Unspecified dementia, unspecified severity, with mood disturbance: Secondary | ICD-10-CM | POA: Diagnosis not present

## 2022-11-03 DIAGNOSIS — M5136 Other intervertebral disc degeneration, lumbar region: Secondary | ICD-10-CM | POA: Diagnosis not present

## 2022-11-03 DIAGNOSIS — I480 Paroxysmal atrial fibrillation: Secondary | ICD-10-CM | POA: Diagnosis not present

## 2022-11-03 DIAGNOSIS — M48062 Spinal stenosis, lumbar region with neurogenic claudication: Secondary | ICD-10-CM | POA: Diagnosis not present

## 2022-11-03 DIAGNOSIS — E119 Type 2 diabetes mellitus without complications: Secondary | ICD-10-CM | POA: Diagnosis not present

## 2022-11-03 DIAGNOSIS — F0394 Unspecified dementia, unspecified severity, with anxiety: Secondary | ICD-10-CM | POA: Diagnosis not present

## 2022-11-03 DIAGNOSIS — F32A Depression, unspecified: Secondary | ICD-10-CM | POA: Diagnosis not present

## 2022-11-03 DIAGNOSIS — M5126 Other intervertebral disc displacement, lumbar region: Secondary | ICD-10-CM | POA: Diagnosis not present

## 2022-11-03 DIAGNOSIS — G8929 Other chronic pain: Secondary | ICD-10-CM | POA: Diagnosis not present

## 2022-11-05 DIAGNOSIS — G8929 Other chronic pain: Secondary | ICD-10-CM | POA: Diagnosis not present

## 2022-11-05 DIAGNOSIS — F32A Depression, unspecified: Secondary | ICD-10-CM | POA: Diagnosis not present

## 2022-11-05 DIAGNOSIS — I480 Paroxysmal atrial fibrillation: Secondary | ICD-10-CM | POA: Diagnosis not present

## 2022-11-05 DIAGNOSIS — M48062 Spinal stenosis, lumbar region with neurogenic claudication: Secondary | ICD-10-CM | POA: Diagnosis not present

## 2022-11-05 DIAGNOSIS — M5136 Other intervertebral disc degeneration, lumbar region: Secondary | ICD-10-CM | POA: Diagnosis not present

## 2022-11-05 DIAGNOSIS — F0394 Unspecified dementia, unspecified severity, with anxiety: Secondary | ICD-10-CM | POA: Diagnosis not present

## 2022-11-05 DIAGNOSIS — F0393 Unspecified dementia, unspecified severity, with mood disturbance: Secondary | ICD-10-CM | POA: Diagnosis not present

## 2022-11-05 DIAGNOSIS — E119 Type 2 diabetes mellitus without complications: Secondary | ICD-10-CM | POA: Diagnosis not present

## 2022-11-05 DIAGNOSIS — M5126 Other intervertebral disc displacement, lumbar region: Secondary | ICD-10-CM | POA: Diagnosis not present

## 2022-11-07 DIAGNOSIS — I480 Paroxysmal atrial fibrillation: Secondary | ICD-10-CM | POA: Diagnosis not present

## 2022-11-07 DIAGNOSIS — G8929 Other chronic pain: Secondary | ICD-10-CM | POA: Diagnosis not present

## 2022-11-07 DIAGNOSIS — E119 Type 2 diabetes mellitus without complications: Secondary | ICD-10-CM | POA: Diagnosis not present

## 2022-11-07 DIAGNOSIS — M48062 Spinal stenosis, lumbar region with neurogenic claudication: Secondary | ICD-10-CM | POA: Diagnosis not present

## 2022-11-07 DIAGNOSIS — M5126 Other intervertebral disc displacement, lumbar region: Secondary | ICD-10-CM | POA: Diagnosis not present

## 2022-11-07 DIAGNOSIS — F0393 Unspecified dementia, unspecified severity, with mood disturbance: Secondary | ICD-10-CM | POA: Diagnosis not present

## 2022-11-07 DIAGNOSIS — M5136 Other intervertebral disc degeneration, lumbar region: Secondary | ICD-10-CM | POA: Diagnosis not present

## 2022-11-07 DIAGNOSIS — F32A Depression, unspecified: Secondary | ICD-10-CM | POA: Diagnosis not present

## 2022-11-07 DIAGNOSIS — F0394 Unspecified dementia, unspecified severity, with anxiety: Secondary | ICD-10-CM | POA: Diagnosis not present

## 2022-11-09 DIAGNOSIS — F0390 Unspecified dementia without behavioral disturbance: Secondary | ICD-10-CM | POA: Diagnosis not present

## 2022-11-09 DIAGNOSIS — R413 Other amnesia: Secondary | ICD-10-CM | POA: Diagnosis not present

## 2022-11-10 DIAGNOSIS — F0394 Unspecified dementia, unspecified severity, with anxiety: Secondary | ICD-10-CM | POA: Diagnosis not present

## 2022-11-10 DIAGNOSIS — F32A Depression, unspecified: Secondary | ICD-10-CM | POA: Diagnosis not present

## 2022-11-10 DIAGNOSIS — I480 Paroxysmal atrial fibrillation: Secondary | ICD-10-CM | POA: Diagnosis not present

## 2022-11-10 DIAGNOSIS — M5126 Other intervertebral disc displacement, lumbar region: Secondary | ICD-10-CM | POA: Diagnosis not present

## 2022-11-10 DIAGNOSIS — F0393 Unspecified dementia, unspecified severity, with mood disturbance: Secondary | ICD-10-CM | POA: Diagnosis not present

## 2022-11-10 DIAGNOSIS — M48062 Spinal stenosis, lumbar region with neurogenic claudication: Secondary | ICD-10-CM | POA: Diagnosis not present

## 2022-11-10 DIAGNOSIS — E119 Type 2 diabetes mellitus without complications: Secondary | ICD-10-CM | POA: Diagnosis not present

## 2022-11-10 DIAGNOSIS — M5136 Other intervertebral disc degeneration, lumbar region: Secondary | ICD-10-CM | POA: Diagnosis not present

## 2022-11-10 DIAGNOSIS — G8929 Other chronic pain: Secondary | ICD-10-CM | POA: Diagnosis not present

## 2022-11-12 DIAGNOSIS — F0394 Unspecified dementia, unspecified severity, with anxiety: Secondary | ICD-10-CM | POA: Diagnosis not present

## 2022-11-12 DIAGNOSIS — M48062 Spinal stenosis, lumbar region with neurogenic claudication: Secondary | ICD-10-CM | POA: Diagnosis not present

## 2022-11-12 DIAGNOSIS — F0393 Unspecified dementia, unspecified severity, with mood disturbance: Secondary | ICD-10-CM | POA: Diagnosis not present

## 2022-11-12 DIAGNOSIS — M5126 Other intervertebral disc displacement, lumbar region: Secondary | ICD-10-CM | POA: Diagnosis not present

## 2022-11-12 DIAGNOSIS — F32A Depression, unspecified: Secondary | ICD-10-CM | POA: Diagnosis not present

## 2022-11-12 DIAGNOSIS — I480 Paroxysmal atrial fibrillation: Secondary | ICD-10-CM | POA: Diagnosis not present

## 2022-11-12 DIAGNOSIS — M5136 Other intervertebral disc degeneration, lumbar region: Secondary | ICD-10-CM | POA: Diagnosis not present

## 2022-11-12 DIAGNOSIS — G8929 Other chronic pain: Secondary | ICD-10-CM | POA: Diagnosis not present

## 2022-11-12 DIAGNOSIS — E119 Type 2 diabetes mellitus without complications: Secondary | ICD-10-CM | POA: Diagnosis not present

## 2022-11-13 DIAGNOSIS — M5136 Other intervertebral disc degeneration, lumbar region: Secondary | ICD-10-CM | POA: Diagnosis not present

## 2022-11-13 DIAGNOSIS — E119 Type 2 diabetes mellitus without complications: Secondary | ICD-10-CM | POA: Diagnosis not present

## 2022-11-13 DIAGNOSIS — F0393 Unspecified dementia, unspecified severity, with mood disturbance: Secondary | ICD-10-CM | POA: Diagnosis not present

## 2022-11-13 DIAGNOSIS — F32A Depression, unspecified: Secondary | ICD-10-CM | POA: Diagnosis not present

## 2022-11-13 DIAGNOSIS — F0394 Unspecified dementia, unspecified severity, with anxiety: Secondary | ICD-10-CM | POA: Diagnosis not present

## 2022-11-13 DIAGNOSIS — I480 Paroxysmal atrial fibrillation: Secondary | ICD-10-CM | POA: Diagnosis not present

## 2022-11-13 DIAGNOSIS — M48062 Spinal stenosis, lumbar region with neurogenic claudication: Secondary | ICD-10-CM | POA: Diagnosis not present

## 2022-11-13 DIAGNOSIS — M5126 Other intervertebral disc displacement, lumbar region: Secondary | ICD-10-CM | POA: Diagnosis not present

## 2022-11-13 DIAGNOSIS — G8929 Other chronic pain: Secondary | ICD-10-CM | POA: Diagnosis not present

## 2022-11-13 DIAGNOSIS — N289 Disorder of kidney and ureter, unspecified: Secondary | ICD-10-CM | POA: Diagnosis not present

## 2022-11-13 DIAGNOSIS — R413 Other amnesia: Secondary | ICD-10-CM | POA: Diagnosis not present

## 2022-11-14 DIAGNOSIS — E119 Type 2 diabetes mellitus without complications: Secondary | ICD-10-CM | POA: Diagnosis not present

## 2022-11-14 DIAGNOSIS — E785 Hyperlipidemia, unspecified: Secondary | ICD-10-CM | POA: Diagnosis not present

## 2022-11-14 DIAGNOSIS — S129XXA Fracture of neck, unspecified, initial encounter: Secondary | ICD-10-CM | POA: Diagnosis not present

## 2022-11-14 DIAGNOSIS — R519 Headache, unspecified: Secondary | ICD-10-CM | POA: Diagnosis not present

## 2022-11-14 DIAGNOSIS — I440 Atrioventricular block, first degree: Secondary | ICD-10-CM | POA: Diagnosis not present

## 2022-11-14 DIAGNOSIS — R4182 Altered mental status, unspecified: Secondary | ICD-10-CM | POA: Diagnosis not present

## 2022-11-14 DIAGNOSIS — S0990XA Unspecified injury of head, initial encounter: Secondary | ICD-10-CM | POA: Diagnosis not present

## 2022-11-14 DIAGNOSIS — I4891 Unspecified atrial fibrillation: Secondary | ICD-10-CM | POA: Diagnosis not present

## 2022-11-14 DIAGNOSIS — Z7901 Long term (current) use of anticoagulants: Secondary | ICD-10-CM | POA: Diagnosis not present

## 2022-11-14 DIAGNOSIS — M47812 Spondylosis without myelopathy or radiculopathy, cervical region: Secondary | ICD-10-CM | POA: Diagnosis not present

## 2022-11-16 DIAGNOSIS — G8929 Other chronic pain: Secondary | ICD-10-CM | POA: Diagnosis not present

## 2022-11-16 DIAGNOSIS — M48062 Spinal stenosis, lumbar region with neurogenic claudication: Secondary | ICD-10-CM | POA: Diagnosis not present

## 2022-11-16 DIAGNOSIS — F0393 Unspecified dementia, unspecified severity, with mood disturbance: Secondary | ICD-10-CM | POA: Diagnosis not present

## 2022-11-16 DIAGNOSIS — F0394 Unspecified dementia, unspecified severity, with anxiety: Secondary | ICD-10-CM | POA: Diagnosis not present

## 2022-11-16 DIAGNOSIS — I480 Paroxysmal atrial fibrillation: Secondary | ICD-10-CM | POA: Diagnosis not present

## 2022-11-16 DIAGNOSIS — M5136 Other intervertebral disc degeneration, lumbar region: Secondary | ICD-10-CM | POA: Diagnosis not present

## 2022-11-16 DIAGNOSIS — E119 Type 2 diabetes mellitus without complications: Secondary | ICD-10-CM | POA: Diagnosis not present

## 2022-11-16 DIAGNOSIS — M5126 Other intervertebral disc displacement, lumbar region: Secondary | ICD-10-CM | POA: Diagnosis not present

## 2022-11-16 DIAGNOSIS — F32A Depression, unspecified: Secondary | ICD-10-CM | POA: Diagnosis not present

## 2022-11-19 DIAGNOSIS — M48062 Spinal stenosis, lumbar region with neurogenic claudication: Secondary | ICD-10-CM | POA: Diagnosis not present

## 2022-11-19 DIAGNOSIS — F0394 Unspecified dementia, unspecified severity, with anxiety: Secondary | ICD-10-CM | POA: Diagnosis not present

## 2022-11-19 DIAGNOSIS — F32A Depression, unspecified: Secondary | ICD-10-CM | POA: Diagnosis not present

## 2022-11-19 DIAGNOSIS — I480 Paroxysmal atrial fibrillation: Secondary | ICD-10-CM | POA: Diagnosis not present

## 2022-11-19 DIAGNOSIS — E119 Type 2 diabetes mellitus without complications: Secondary | ICD-10-CM | POA: Diagnosis not present

## 2022-11-19 DIAGNOSIS — G8929 Other chronic pain: Secondary | ICD-10-CM | POA: Diagnosis not present

## 2022-11-19 DIAGNOSIS — M5126 Other intervertebral disc displacement, lumbar region: Secondary | ICD-10-CM | POA: Diagnosis not present

## 2022-11-19 DIAGNOSIS — F0393 Unspecified dementia, unspecified severity, with mood disturbance: Secondary | ICD-10-CM | POA: Diagnosis not present

## 2022-11-19 DIAGNOSIS — M5136 Other intervertebral disc degeneration, lumbar region: Secondary | ICD-10-CM | POA: Diagnosis not present

## 2022-11-23 DIAGNOSIS — I480 Paroxysmal atrial fibrillation: Secondary | ICD-10-CM | POA: Diagnosis not present

## 2022-11-23 DIAGNOSIS — G8929 Other chronic pain: Secondary | ICD-10-CM | POA: Diagnosis not present

## 2022-11-23 DIAGNOSIS — F32A Depression, unspecified: Secondary | ICD-10-CM | POA: Diagnosis not present

## 2022-11-23 DIAGNOSIS — E119 Type 2 diabetes mellitus without complications: Secondary | ICD-10-CM | POA: Diagnosis not present

## 2022-11-23 DIAGNOSIS — M5136 Other intervertebral disc degeneration, lumbar region: Secondary | ICD-10-CM | POA: Diagnosis not present

## 2022-11-23 DIAGNOSIS — M5126 Other intervertebral disc displacement, lumbar region: Secondary | ICD-10-CM | POA: Diagnosis not present

## 2022-11-23 DIAGNOSIS — M48062 Spinal stenosis, lumbar region with neurogenic claudication: Secondary | ICD-10-CM | POA: Diagnosis not present

## 2022-11-23 DIAGNOSIS — F0393 Unspecified dementia, unspecified severity, with mood disturbance: Secondary | ICD-10-CM | POA: Diagnosis not present

## 2022-11-23 DIAGNOSIS — F0394 Unspecified dementia, unspecified severity, with anxiety: Secondary | ICD-10-CM | POA: Diagnosis not present

## 2022-11-24 DIAGNOSIS — F32A Depression, unspecified: Secondary | ICD-10-CM | POA: Diagnosis not present

## 2022-11-24 DIAGNOSIS — M48062 Spinal stenosis, lumbar region with neurogenic claudication: Secondary | ICD-10-CM | POA: Diagnosis not present

## 2022-11-24 DIAGNOSIS — E119 Type 2 diabetes mellitus without complications: Secondary | ICD-10-CM | POA: Diagnosis not present

## 2022-11-24 DIAGNOSIS — F0393 Unspecified dementia, unspecified severity, with mood disturbance: Secondary | ICD-10-CM | POA: Diagnosis not present

## 2022-11-24 DIAGNOSIS — M5136 Other intervertebral disc degeneration, lumbar region: Secondary | ICD-10-CM | POA: Diagnosis not present

## 2022-11-24 DIAGNOSIS — M5126 Other intervertebral disc displacement, lumbar region: Secondary | ICD-10-CM | POA: Diagnosis not present

## 2022-11-24 DIAGNOSIS — I480 Paroxysmal atrial fibrillation: Secondary | ICD-10-CM | POA: Diagnosis not present

## 2022-11-24 DIAGNOSIS — G8929 Other chronic pain: Secondary | ICD-10-CM | POA: Diagnosis not present

## 2022-11-24 DIAGNOSIS — F0394 Unspecified dementia, unspecified severity, with anxiety: Secondary | ICD-10-CM | POA: Diagnosis not present

## 2022-12-02 DIAGNOSIS — E119 Type 2 diabetes mellitus without complications: Secondary | ICD-10-CM | POA: Diagnosis not present

## 2022-12-02 DIAGNOSIS — M5136 Other intervertebral disc degeneration, lumbar region: Secondary | ICD-10-CM | POA: Diagnosis not present

## 2022-12-02 DIAGNOSIS — F0394 Unspecified dementia, unspecified severity, with anxiety: Secondary | ICD-10-CM | POA: Diagnosis not present

## 2022-12-02 DIAGNOSIS — G8929 Other chronic pain: Secondary | ICD-10-CM | POA: Diagnosis not present

## 2022-12-02 DIAGNOSIS — F0393 Unspecified dementia, unspecified severity, with mood disturbance: Secondary | ICD-10-CM | POA: Diagnosis not present

## 2022-12-02 DIAGNOSIS — I480 Paroxysmal atrial fibrillation: Secondary | ICD-10-CM | POA: Diagnosis not present

## 2022-12-02 DIAGNOSIS — F32A Depression, unspecified: Secondary | ICD-10-CM | POA: Diagnosis not present

## 2022-12-02 DIAGNOSIS — M48062 Spinal stenosis, lumbar region with neurogenic claudication: Secondary | ICD-10-CM | POA: Diagnosis not present

## 2022-12-02 DIAGNOSIS — M5126 Other intervertebral disc displacement, lumbar region: Secondary | ICD-10-CM | POA: Diagnosis not present

## 2022-12-09 DIAGNOSIS — M48062 Spinal stenosis, lumbar region with neurogenic claudication: Secondary | ICD-10-CM | POA: Diagnosis not present

## 2022-12-09 DIAGNOSIS — M5126 Other intervertebral disc displacement, lumbar region: Secondary | ICD-10-CM | POA: Diagnosis not present

## 2022-12-09 DIAGNOSIS — F0394 Unspecified dementia, unspecified severity, with anxiety: Secondary | ICD-10-CM | POA: Diagnosis not present

## 2022-12-09 DIAGNOSIS — E119 Type 2 diabetes mellitus without complications: Secondary | ICD-10-CM | POA: Diagnosis not present

## 2022-12-09 DIAGNOSIS — I480 Paroxysmal atrial fibrillation: Secondary | ICD-10-CM | POA: Diagnosis not present

## 2022-12-09 DIAGNOSIS — M5136 Other intervertebral disc degeneration, lumbar region: Secondary | ICD-10-CM | POA: Diagnosis not present

## 2022-12-09 DIAGNOSIS — F0393 Unspecified dementia, unspecified severity, with mood disturbance: Secondary | ICD-10-CM | POA: Diagnosis not present

## 2022-12-09 DIAGNOSIS — F32A Depression, unspecified: Secondary | ICD-10-CM | POA: Diagnosis not present

## 2022-12-09 DIAGNOSIS — G8929 Other chronic pain: Secondary | ICD-10-CM | POA: Diagnosis not present

## 2022-12-10 DIAGNOSIS — E119 Type 2 diabetes mellitus without complications: Secondary | ICD-10-CM | POA: Diagnosis not present

## 2022-12-10 DIAGNOSIS — M5136 Other intervertebral disc degeneration, lumbar region: Secondary | ICD-10-CM | POA: Diagnosis not present

## 2022-12-10 DIAGNOSIS — F0394 Unspecified dementia, unspecified severity, with anxiety: Secondary | ICD-10-CM | POA: Diagnosis not present

## 2022-12-10 DIAGNOSIS — G8929 Other chronic pain: Secondary | ICD-10-CM | POA: Diagnosis not present

## 2022-12-10 DIAGNOSIS — F32A Depression, unspecified: Secondary | ICD-10-CM | POA: Diagnosis not present

## 2022-12-10 DIAGNOSIS — M5126 Other intervertebral disc displacement, lumbar region: Secondary | ICD-10-CM | POA: Diagnosis not present

## 2022-12-10 DIAGNOSIS — I480 Paroxysmal atrial fibrillation: Secondary | ICD-10-CM | POA: Diagnosis not present

## 2022-12-10 DIAGNOSIS — M48062 Spinal stenosis, lumbar region with neurogenic claudication: Secondary | ICD-10-CM | POA: Diagnosis not present

## 2022-12-10 DIAGNOSIS — F0393 Unspecified dementia, unspecified severity, with mood disturbance: Secondary | ICD-10-CM | POA: Diagnosis not present

## 2022-12-12 DIAGNOSIS — R918 Other nonspecific abnormal finding of lung field: Secondary | ICD-10-CM | POA: Diagnosis not present

## 2022-12-12 DIAGNOSIS — R0781 Pleurodynia: Secondary | ICD-10-CM | POA: Diagnosis not present

## 2022-12-12 DIAGNOSIS — M546 Pain in thoracic spine: Secondary | ICD-10-CM | POA: Diagnosis not present

## 2022-12-12 DIAGNOSIS — Z853 Personal history of malignant neoplasm of breast: Secondary | ICD-10-CM | POA: Diagnosis not present

## 2022-12-12 DIAGNOSIS — M25561 Pain in right knee: Secondary | ICD-10-CM | POA: Diagnosis not present

## 2022-12-12 DIAGNOSIS — E119 Type 2 diabetes mellitus without complications: Secondary | ICD-10-CM | POA: Diagnosis not present

## 2022-12-12 DIAGNOSIS — Z7901 Long term (current) use of anticoagulants: Secondary | ICD-10-CM | POA: Diagnosis not present

## 2022-12-12 DIAGNOSIS — N3000 Acute cystitis without hematuria: Secondary | ICD-10-CM | POA: Diagnosis not present

## 2022-12-12 DIAGNOSIS — S29009A Unspecified injury of muscle and tendon of unspecified wall of thorax, initial encounter: Secondary | ICD-10-CM | POA: Diagnosis not present

## 2022-12-12 DIAGNOSIS — M25551 Pain in right hip: Secondary | ICD-10-CM | POA: Diagnosis not present

## 2022-12-12 DIAGNOSIS — S80211A Abrasion, right knee, initial encounter: Secondary | ICD-10-CM | POA: Diagnosis not present

## 2022-12-12 DIAGNOSIS — Z043 Encounter for examination and observation following other accident: Secondary | ICD-10-CM | POA: Diagnosis not present

## 2022-12-12 DIAGNOSIS — E278 Other specified disorders of adrenal gland: Secondary | ICD-10-CM | POA: Diagnosis not present

## 2022-12-12 DIAGNOSIS — F0390 Unspecified dementia without behavioral disturbance: Secondary | ICD-10-CM | POA: Diagnosis not present

## 2022-12-12 DIAGNOSIS — R9431 Abnormal electrocardiogram [ECG] [EKG]: Secondary | ICD-10-CM | POA: Diagnosis not present

## 2022-12-12 DIAGNOSIS — E785 Hyperlipidemia, unspecified: Secondary | ICD-10-CM | POA: Diagnosis not present

## 2022-12-12 DIAGNOSIS — S80212A Abrasion, left knee, initial encounter: Secondary | ICD-10-CM | POA: Diagnosis not present

## 2022-12-12 DIAGNOSIS — I4891 Unspecified atrial fibrillation: Secondary | ICD-10-CM | POA: Diagnosis not present

## 2022-12-12 DIAGNOSIS — M545 Low back pain, unspecified: Secondary | ICD-10-CM | POA: Diagnosis not present

## 2022-12-12 DIAGNOSIS — S2243XD Multiple fractures of ribs, bilateral, subsequent encounter for fracture with routine healing: Secondary | ICD-10-CM | POA: Diagnosis not present

## 2022-12-14 DIAGNOSIS — M810 Age-related osteoporosis without current pathological fracture: Secondary | ICD-10-CM | POA: Diagnosis not present

## 2022-12-14 DIAGNOSIS — E119 Type 2 diabetes mellitus without complications: Secondary | ICD-10-CM | POA: Diagnosis not present

## 2022-12-14 DIAGNOSIS — F419 Anxiety disorder, unspecified: Secondary | ICD-10-CM | POA: Diagnosis not present

## 2022-12-14 DIAGNOSIS — I480 Paroxysmal atrial fibrillation: Secondary | ICD-10-CM | POA: Diagnosis not present

## 2022-12-14 DIAGNOSIS — R630 Anorexia: Secondary | ICD-10-CM | POA: Diagnosis not present

## 2022-12-14 DIAGNOSIS — R634 Abnormal weight loss: Secondary | ICD-10-CM | POA: Diagnosis not present

## 2022-12-14 DIAGNOSIS — E782 Mixed hyperlipidemia: Secondary | ICD-10-CM | POA: Diagnosis not present

## 2022-12-30 DIAGNOSIS — Z7984 Long term (current) use of oral hypoglycemic drugs: Secondary | ICD-10-CM | POA: Diagnosis not present

## 2022-12-30 DIAGNOSIS — R339 Retention of urine, unspecified: Secondary | ICD-10-CM | POA: Diagnosis not present

## 2022-12-30 DIAGNOSIS — R944 Abnormal results of kidney function studies: Secondary | ICD-10-CM | POA: Diagnosis not present

## 2022-12-30 DIAGNOSIS — E119 Type 2 diabetes mellitus without complications: Secondary | ICD-10-CM | POA: Diagnosis not present

## 2022-12-30 DIAGNOSIS — Z885 Allergy status to narcotic agent status: Secondary | ICD-10-CM | POA: Diagnosis not present

## 2022-12-30 DIAGNOSIS — E7800 Pure hypercholesterolemia, unspecified: Secondary | ICD-10-CM | POA: Diagnosis not present

## 2022-12-30 DIAGNOSIS — Z79899 Other long term (current) drug therapy: Secondary | ICD-10-CM | POA: Diagnosis not present

## 2022-12-30 DIAGNOSIS — Z7901 Long term (current) use of anticoagulants: Secondary | ICD-10-CM | POA: Diagnosis not present

## 2022-12-30 DIAGNOSIS — Z7983 Long term (current) use of bisphosphonates: Secondary | ICD-10-CM | POA: Diagnosis not present

## 2022-12-30 DIAGNOSIS — K5900 Constipation, unspecified: Secondary | ICD-10-CM | POA: Diagnosis not present

## 2023-01-13 DIAGNOSIS — C50912 Malignant neoplasm of unspecified site of left female breast: Secondary | ICD-10-CM | POA: Diagnosis not present

## 2023-01-13 DIAGNOSIS — K219 Gastro-esophageal reflux disease without esophagitis: Secondary | ICD-10-CM | POA: Diagnosis not present

## 2023-01-13 DIAGNOSIS — F0390 Unspecified dementia without behavioral disturbance: Secondary | ICD-10-CM | POA: Diagnosis not present

## 2023-01-13 DIAGNOSIS — Z7984 Long term (current) use of oral hypoglycemic drugs: Secondary | ICD-10-CM | POA: Diagnosis not present

## 2023-01-13 DIAGNOSIS — E119 Type 2 diabetes mellitus without complications: Secondary | ICD-10-CM | POA: Diagnosis not present

## 2023-01-13 DIAGNOSIS — I480 Paroxysmal atrial fibrillation: Secondary | ICD-10-CM | POA: Diagnosis not present

## 2023-01-13 DIAGNOSIS — I4891 Unspecified atrial fibrillation: Secondary | ICD-10-CM | POA: Diagnosis not present

## 2023-01-27 DIAGNOSIS — I4891 Unspecified atrial fibrillation: Secondary | ICD-10-CM | POA: Diagnosis not present

## 2023-02-04 DIAGNOSIS — N3000 Acute cystitis without hematuria: Secondary | ICD-10-CM | POA: Diagnosis not present

## 2023-02-04 DIAGNOSIS — R233 Spontaneous ecchymoses: Secondary | ICD-10-CM | POA: Diagnosis not present

## 2023-02-21 DIAGNOSIS — S52301D Unspecified fracture of shaft of right radius, subsequent encounter for closed fracture with routine healing: Secondary | ICD-10-CM | POA: Diagnosis not present

## 2023-02-21 DIAGNOSIS — M47814 Spondylosis without myelopathy or radiculopathy, thoracic region: Secondary | ICD-10-CM | POA: Diagnosis not present

## 2023-02-21 DIAGNOSIS — R9431 Abnormal electrocardiogram [ECG] [EKG]: Secondary | ICD-10-CM | POA: Diagnosis not present

## 2023-02-21 DIAGNOSIS — F419 Anxiety disorder, unspecified: Secondary | ICD-10-CM | POA: Diagnosis not present

## 2023-02-21 DIAGNOSIS — G8929 Other chronic pain: Secondary | ICD-10-CM | POA: Diagnosis not present

## 2023-02-21 DIAGNOSIS — W19XXXA Unspecified fall, initial encounter: Secondary | ICD-10-CM | POA: Diagnosis not present

## 2023-02-21 DIAGNOSIS — S59901A Unspecified injury of right elbow, initial encounter: Secondary | ICD-10-CM | POA: Diagnosis not present

## 2023-02-21 DIAGNOSIS — R109 Unspecified abdominal pain: Secondary | ICD-10-CM | POA: Diagnosis not present

## 2023-02-21 DIAGNOSIS — S52201D Unspecified fracture of shaft of right ulna, subsequent encounter for closed fracture with routine healing: Secondary | ICD-10-CM | POA: Diagnosis not present

## 2023-02-21 DIAGNOSIS — J918 Pleural effusion in other conditions classified elsewhere: Secondary | ICD-10-CM | POA: Diagnosis not present

## 2023-02-21 DIAGNOSIS — S59911A Unspecified injury of right forearm, initial encounter: Secondary | ICD-10-CM | POA: Diagnosis not present

## 2023-02-21 DIAGNOSIS — I480 Paroxysmal atrial fibrillation: Secondary | ICD-10-CM | POA: Diagnosis not present

## 2023-02-21 DIAGNOSIS — J189 Pneumonia, unspecified organism: Secondary | ICD-10-CM | POA: Diagnosis not present

## 2023-02-21 DIAGNOSIS — M542 Cervicalgia: Secondary | ICD-10-CM | POA: Diagnosis not present

## 2023-02-21 DIAGNOSIS — D539 Nutritional anemia, unspecified: Secondary | ICD-10-CM | POA: Diagnosis not present

## 2023-02-21 DIAGNOSIS — N10 Acute tubulo-interstitial nephritis: Secondary | ICD-10-CM | POA: Diagnosis not present

## 2023-02-21 DIAGNOSIS — J811 Chronic pulmonary edema: Secondary | ICD-10-CM | POA: Diagnosis not present

## 2023-02-21 DIAGNOSIS — M47816 Spondylosis without myelopathy or radiculopathy, lumbar region: Secondary | ICD-10-CM | POA: Diagnosis not present

## 2023-02-21 DIAGNOSIS — S52601A Unspecified fracture of lower end of right ulna, initial encounter for closed fracture: Secondary | ICD-10-CM | POA: Diagnosis not present

## 2023-02-21 DIAGNOSIS — B965 Pseudomonas (aeruginosa) (mallei) (pseudomallei) as the cause of diseases classified elsewhere: Secondary | ICD-10-CM | POA: Diagnosis not present

## 2023-02-21 DIAGNOSIS — R140 Abdominal distension (gaseous): Secondary | ICD-10-CM | POA: Diagnosis not present

## 2023-02-21 DIAGNOSIS — S0990XA Unspecified injury of head, initial encounter: Secondary | ICD-10-CM | POA: Diagnosis not present

## 2023-02-21 DIAGNOSIS — M545 Low back pain, unspecified: Secondary | ICD-10-CM | POA: Diagnosis not present

## 2023-02-21 DIAGNOSIS — R339 Retention of urine, unspecified: Secondary | ICD-10-CM | POA: Diagnosis not present

## 2023-02-21 DIAGNOSIS — R630 Anorexia: Secondary | ICD-10-CM | POA: Diagnosis not present

## 2023-02-21 DIAGNOSIS — F0390 Unspecified dementia without behavioral disturbance: Secondary | ICD-10-CM | POA: Diagnosis not present

## 2023-02-21 DIAGNOSIS — S52611A Displaced fracture of right ulna styloid process, initial encounter for closed fracture: Secondary | ICD-10-CM | POA: Diagnosis not present

## 2023-02-21 DIAGNOSIS — R5383 Other fatigue: Secondary | ICD-10-CM | POA: Diagnosis not present

## 2023-02-21 DIAGNOSIS — R8281 Pyuria: Secondary | ICD-10-CM | POA: Diagnosis not present

## 2023-02-21 DIAGNOSIS — M5134 Other intervertebral disc degeneration, thoracic region: Secondary | ICD-10-CM | POA: Diagnosis not present

## 2023-02-21 DIAGNOSIS — R001 Bradycardia, unspecified: Secondary | ICD-10-CM | POA: Diagnosis not present

## 2023-02-21 DIAGNOSIS — S52351A Displaced comminuted fracture of shaft of radius, right arm, initial encounter for closed fracture: Secondary | ICD-10-CM | POA: Diagnosis not present

## 2023-02-21 DIAGNOSIS — S52591A Other fractures of lower end of right radius, initial encounter for closed fracture: Secondary | ICD-10-CM | POA: Diagnosis not present

## 2023-02-21 DIAGNOSIS — I3139 Other pericardial effusion (noninflammatory): Secondary | ICD-10-CM | POA: Diagnosis not present

## 2023-02-21 DIAGNOSIS — R54 Age-related physical debility: Secondary | ICD-10-CM | POA: Diagnosis not present

## 2023-02-21 DIAGNOSIS — I4891 Unspecified atrial fibrillation: Secondary | ICD-10-CM | POA: Diagnosis not present

## 2023-02-21 DIAGNOSIS — S4991XA Unspecified injury of right shoulder and upper arm, initial encounter: Secondary | ICD-10-CM | POA: Diagnosis not present

## 2023-02-21 DIAGNOSIS — R052 Subacute cough: Secondary | ICD-10-CM | POA: Diagnosis not present

## 2023-02-21 DIAGNOSIS — M47812 Spondylosis without myelopathy or radiculopathy, cervical region: Secondary | ICD-10-CM | POA: Diagnosis not present

## 2023-02-21 DIAGNOSIS — G309 Alzheimer's disease, unspecified: Secondary | ICD-10-CM | POA: Diagnosis not present

## 2023-02-21 DIAGNOSIS — R410 Disorientation, unspecified: Secondary | ICD-10-CM | POA: Diagnosis not present

## 2023-02-21 DIAGNOSIS — N390 Urinary tract infection, site not specified: Secondary | ICD-10-CM | POA: Diagnosis not present

## 2023-02-21 DIAGNOSIS — M6281 Muscle weakness (generalized): Secondary | ICD-10-CM | POA: Diagnosis not present

## 2023-02-21 DIAGNOSIS — N3000 Acute cystitis without hematuria: Secondary | ICD-10-CM | POA: Diagnosis not present

## 2023-02-21 DIAGNOSIS — S52501D Unspecified fracture of the lower end of right radius, subsequent encounter for closed fracture with routine healing: Secondary | ICD-10-CM | POA: Diagnosis not present

## 2023-02-21 DIAGNOSIS — S52571D Other intraarticular fracture of lower end of right radius, subsequent encounter for closed fracture with routine healing: Secondary | ICD-10-CM | POA: Diagnosis not present

## 2023-02-21 DIAGNOSIS — I272 Pulmonary hypertension, unspecified: Secondary | ICD-10-CM | POA: Diagnosis not present

## 2023-02-21 DIAGNOSIS — J90 Pleural effusion, not elsewhere classified: Secondary | ICD-10-CM | POA: Diagnosis not present

## 2023-02-21 DIAGNOSIS — M549 Dorsalgia, unspecified: Secondary | ICD-10-CM | POA: Diagnosis not present

## 2023-02-21 DIAGNOSIS — R5381 Other malaise: Secondary | ICD-10-CM | POA: Diagnosis not present

## 2023-02-21 DIAGNOSIS — K5900 Constipation, unspecified: Secondary | ICD-10-CM | POA: Diagnosis not present

## 2023-02-21 DIAGNOSIS — E1122 Type 2 diabetes mellitus with diabetic chronic kidney disease: Secondary | ICD-10-CM | POA: Diagnosis not present

## 2023-02-21 DIAGNOSIS — F05 Delirium due to known physiological condition: Secondary | ICD-10-CM | POA: Diagnosis not present

## 2023-02-21 DIAGNOSIS — E440 Moderate protein-calorie malnutrition: Secondary | ICD-10-CM | POA: Diagnosis not present

## 2023-02-21 DIAGNOSIS — M79601 Pain in right arm: Secondary | ICD-10-CM | POA: Diagnosis not present

## 2023-02-21 DIAGNOSIS — R918 Other nonspecific abnormal finding of lung field: Secondary | ICD-10-CM | POA: Diagnosis not present

## 2023-02-21 DIAGNOSIS — S52501A Unspecified fracture of the lower end of right radius, initial encounter for closed fracture: Secondary | ICD-10-CM | POA: Diagnosis not present

## 2023-02-21 DIAGNOSIS — R52 Pain, unspecified: Secondary | ICD-10-CM | POA: Diagnosis not present

## 2023-02-21 DIAGNOSIS — S52201A Unspecified fracture of shaft of right ulna, initial encounter for closed fracture: Secondary | ICD-10-CM | POA: Diagnosis not present

## 2023-02-21 DIAGNOSIS — M8580 Other specified disorders of bone density and structure, unspecified site: Secondary | ICD-10-CM | POA: Diagnosis not present

## 2023-02-21 DIAGNOSIS — D509 Iron deficiency anemia, unspecified: Secondary | ICD-10-CM | POA: Diagnosis not present

## 2023-02-21 DIAGNOSIS — W19XXXD Unspecified fall, subsequent encounter: Secondary | ICD-10-CM | POA: Diagnosis not present

## 2023-02-21 DIAGNOSIS — R059 Cough, unspecified: Secondary | ICD-10-CM | POA: Diagnosis not present

## 2023-02-21 DIAGNOSIS — R279 Unspecified lack of coordination: Secondary | ICD-10-CM | POA: Diagnosis not present

## 2023-02-21 DIAGNOSIS — J1569 Pneumonia due to other gram-negative bacteria: Secondary | ICD-10-CM | POA: Diagnosis not present

## 2023-02-21 DIAGNOSIS — N189 Chronic kidney disease, unspecified: Secondary | ICD-10-CM | POA: Diagnosis not present

## 2023-02-21 DIAGNOSIS — S2241XA Multiple fractures of ribs, right side, initial encounter for closed fracture: Secondary | ICD-10-CM | POA: Diagnosis not present

## 2023-02-22 DIAGNOSIS — R109 Unspecified abdominal pain: Secondary | ICD-10-CM | POA: Diagnosis not present

## 2023-02-22 DIAGNOSIS — J189 Pneumonia, unspecified organism: Secondary | ICD-10-CM | POA: Diagnosis not present

## 2023-02-22 DIAGNOSIS — N10 Acute tubulo-interstitial nephritis: Secondary | ICD-10-CM | POA: Diagnosis not present

## 2023-02-22 DIAGNOSIS — I4891 Unspecified atrial fibrillation: Secondary | ICD-10-CM | POA: Diagnosis not present

## 2023-02-22 DIAGNOSIS — R8281 Pyuria: Secondary | ICD-10-CM | POA: Diagnosis not present

## 2023-02-22 DIAGNOSIS — D539 Nutritional anemia, unspecified: Secondary | ICD-10-CM | POA: Diagnosis not present

## 2023-02-22 DIAGNOSIS — N390 Urinary tract infection, site not specified: Secondary | ICD-10-CM | POA: Diagnosis not present

## 2023-02-23 DIAGNOSIS — N390 Urinary tract infection, site not specified: Secondary | ICD-10-CM | POA: Diagnosis not present

## 2023-02-23 DIAGNOSIS — J189 Pneumonia, unspecified organism: Secondary | ICD-10-CM | POA: Diagnosis not present

## 2023-02-23 DIAGNOSIS — J918 Pleural effusion in other conditions classified elsewhere: Secondary | ICD-10-CM | POA: Diagnosis not present

## 2023-02-23 DIAGNOSIS — I3139 Other pericardial effusion (noninflammatory): Secondary | ICD-10-CM | POA: Diagnosis not present

## 2023-02-23 DIAGNOSIS — M545 Low back pain, unspecified: Secondary | ICD-10-CM | POA: Diagnosis not present

## 2023-02-23 DIAGNOSIS — R339 Retention of urine, unspecified: Secondary | ICD-10-CM | POA: Diagnosis not present

## 2023-02-23 DIAGNOSIS — I272 Pulmonary hypertension, unspecified: Secondary | ICD-10-CM | POA: Diagnosis not present

## 2023-02-23 DIAGNOSIS — K5900 Constipation, unspecified: Secondary | ICD-10-CM | POA: Diagnosis not present

## 2023-02-24 DIAGNOSIS — N390 Urinary tract infection, site not specified: Secondary | ICD-10-CM | POA: Diagnosis not present

## 2023-02-24 DIAGNOSIS — K5900 Constipation, unspecified: Secondary | ICD-10-CM | POA: Diagnosis not present

## 2023-02-24 DIAGNOSIS — M545 Low back pain, unspecified: Secondary | ICD-10-CM | POA: Diagnosis not present

## 2023-02-24 DIAGNOSIS — I4891 Unspecified atrial fibrillation: Secondary | ICD-10-CM | POA: Diagnosis not present

## 2023-02-24 DIAGNOSIS — R339 Retention of urine, unspecified: Secondary | ICD-10-CM | POA: Diagnosis not present

## 2023-02-25 DIAGNOSIS — M545 Low back pain, unspecified: Secondary | ICD-10-CM | POA: Diagnosis not present

## 2023-02-25 DIAGNOSIS — R339 Retention of urine, unspecified: Secondary | ICD-10-CM | POA: Diagnosis not present

## 2023-02-25 DIAGNOSIS — I4891 Unspecified atrial fibrillation: Secondary | ICD-10-CM | POA: Diagnosis not present

## 2023-02-25 DIAGNOSIS — N390 Urinary tract infection, site not specified: Secondary | ICD-10-CM | POA: Diagnosis not present

## 2023-02-25 DIAGNOSIS — M8580 Other specified disorders of bone density and structure, unspecified site: Secondary | ICD-10-CM | POA: Diagnosis not present

## 2023-02-25 DIAGNOSIS — K5900 Constipation, unspecified: Secondary | ICD-10-CM | POA: Diagnosis not present

## 2023-02-25 DIAGNOSIS — M5134 Other intervertebral disc degeneration, thoracic region: Secondary | ICD-10-CM | POA: Diagnosis not present

## 2023-02-26 DIAGNOSIS — B965 Pseudomonas (aeruginosa) (mallei) (pseudomallei) as the cause of diseases classified elsewhere: Secondary | ICD-10-CM | POA: Diagnosis not present

## 2023-02-26 DIAGNOSIS — R339 Retention of urine, unspecified: Secondary | ICD-10-CM | POA: Diagnosis not present

## 2023-02-26 DIAGNOSIS — I4891 Unspecified atrial fibrillation: Secondary | ICD-10-CM | POA: Diagnosis not present

## 2023-02-26 DIAGNOSIS — J90 Pleural effusion, not elsewhere classified: Secondary | ICD-10-CM | POA: Diagnosis not present

## 2023-02-26 DIAGNOSIS — N390 Urinary tract infection, site not specified: Secondary | ICD-10-CM | POA: Diagnosis not present

## 2023-02-26 DIAGNOSIS — R410 Disorientation, unspecified: Secondary | ICD-10-CM | POA: Diagnosis not present

## 2023-02-27 DIAGNOSIS — B965 Pseudomonas (aeruginosa) (mallei) (pseudomallei) as the cause of diseases classified elsewhere: Secondary | ICD-10-CM | POA: Diagnosis not present

## 2023-02-27 DIAGNOSIS — N390 Urinary tract infection, site not specified: Secondary | ICD-10-CM | POA: Diagnosis not present

## 2023-02-27 DIAGNOSIS — I4891 Unspecified atrial fibrillation: Secondary | ICD-10-CM | POA: Diagnosis not present

## 2023-02-27 DIAGNOSIS — R410 Disorientation, unspecified: Secondary | ICD-10-CM | POA: Diagnosis not present

## 2023-02-27 DIAGNOSIS — R339 Retention of urine, unspecified: Secondary | ICD-10-CM | POA: Diagnosis not present

## 2023-02-27 DIAGNOSIS — J90 Pleural effusion, not elsewhere classified: Secondary | ICD-10-CM | POA: Diagnosis not present

## 2023-02-28 DIAGNOSIS — M79601 Pain in right arm: Secondary | ICD-10-CM | POA: Diagnosis not present

## 2023-02-28 DIAGNOSIS — G309 Alzheimer's disease, unspecified: Secondary | ICD-10-CM | POA: Diagnosis not present

## 2023-02-28 DIAGNOSIS — N3000 Acute cystitis without hematuria: Secondary | ICD-10-CM | POA: Diagnosis not present

## 2023-02-28 DIAGNOSIS — N390 Urinary tract infection, site not specified: Secondary | ICD-10-CM | POA: Diagnosis not present

## 2023-02-28 DIAGNOSIS — R052 Subacute cough: Secondary | ICD-10-CM | POA: Diagnosis not present

## 2023-02-28 DIAGNOSIS — M47816 Spondylosis without myelopathy or radiculopathy, lumbar region: Secondary | ICD-10-CM | POA: Diagnosis not present

## 2023-02-28 DIAGNOSIS — S59911A Unspecified injury of right forearm, initial encounter: Secondary | ICD-10-CM | POA: Diagnosis not present

## 2023-02-28 DIAGNOSIS — S52501A Unspecified fracture of the lower end of right radius, initial encounter for closed fracture: Secondary | ICD-10-CM | POA: Diagnosis not present

## 2023-02-28 DIAGNOSIS — B965 Pseudomonas (aeruginosa) (mallei) (pseudomallei) as the cause of diseases classified elsewhere: Secondary | ICD-10-CM | POA: Diagnosis not present

## 2023-02-28 DIAGNOSIS — W19XXXA Unspecified fall, initial encounter: Secondary | ICD-10-CM | POA: Diagnosis not present

## 2023-02-28 DIAGNOSIS — M47814 Spondylosis without myelopathy or radiculopathy, thoracic region: Secondary | ICD-10-CM | POA: Diagnosis not present

## 2023-02-28 DIAGNOSIS — S4991XA Unspecified injury of right shoulder and upper arm, initial encounter: Secondary | ICD-10-CM | POA: Diagnosis not present

## 2023-02-28 DIAGNOSIS — S52571D Other intraarticular fracture of lower end of right radius, subsequent encounter for closed fracture with routine healing: Secondary | ICD-10-CM | POA: Diagnosis not present

## 2023-02-28 DIAGNOSIS — M545 Low back pain, unspecified: Secondary | ICD-10-CM | POA: Diagnosis not present

## 2023-02-28 DIAGNOSIS — J90 Pleural effusion, not elsewhere classified: Secondary | ICD-10-CM | POA: Diagnosis not present

## 2023-02-28 DIAGNOSIS — S59901A Unspecified injury of right elbow, initial encounter: Secondary | ICD-10-CM | POA: Diagnosis not present

## 2023-02-28 DIAGNOSIS — F419 Anxiety disorder, unspecified: Secondary | ICD-10-CM | POA: Diagnosis not present

## 2023-02-28 DIAGNOSIS — S2241XA Multiple fractures of ribs, right side, initial encounter for closed fracture: Secondary | ICD-10-CM | POA: Diagnosis not present

## 2023-02-28 DIAGNOSIS — M542 Cervicalgia: Secondary | ICD-10-CM | POA: Diagnosis not present

## 2023-02-28 DIAGNOSIS — M549 Dorsalgia, unspecified: Secondary | ICD-10-CM | POA: Diagnosis not present

## 2023-02-28 DIAGNOSIS — J189 Pneumonia, unspecified organism: Secondary | ICD-10-CM | POA: Diagnosis not present

## 2023-02-28 DIAGNOSIS — S52601A Unspecified fracture of lower end of right ulna, initial encounter for closed fracture: Secondary | ICD-10-CM | POA: Diagnosis not present

## 2023-02-28 DIAGNOSIS — R54 Age-related physical debility: Secondary | ICD-10-CM | POA: Diagnosis not present

## 2023-02-28 DIAGNOSIS — S0990XA Unspecified injury of head, initial encounter: Secondary | ICD-10-CM | POA: Diagnosis not present

## 2023-02-28 DIAGNOSIS — R630 Anorexia: Secondary | ICD-10-CM | POA: Diagnosis not present

## 2023-02-28 DIAGNOSIS — S52611A Displaced fracture of right ulna styloid process, initial encounter for closed fracture: Secondary | ICD-10-CM | POA: Diagnosis not present

## 2023-02-28 DIAGNOSIS — M47812 Spondylosis without myelopathy or radiculopathy, cervical region: Secondary | ICD-10-CM | POA: Diagnosis not present

## 2023-02-28 DIAGNOSIS — S52201D Unspecified fracture of shaft of right ulna, subsequent encounter for closed fracture with routine healing: Secondary | ICD-10-CM | POA: Diagnosis not present

## 2023-02-28 DIAGNOSIS — S52351A Displaced comminuted fracture of shaft of radius, right arm, initial encounter for closed fracture: Secondary | ICD-10-CM | POA: Diagnosis not present

## 2023-03-11 DIAGNOSIS — R339 Retention of urine, unspecified: Secondary | ICD-10-CM | POA: Diagnosis not present

## 2023-03-11 DIAGNOSIS — S52591A Other fractures of lower end of right radius, initial encounter for closed fracture: Secondary | ICD-10-CM | POA: Diagnosis not present

## 2023-03-11 DIAGNOSIS — R5381 Other malaise: Secondary | ICD-10-CM | POA: Diagnosis not present

## 2023-03-11 DIAGNOSIS — W19XXXD Unspecified fall, subsequent encounter: Secondary | ICD-10-CM | POA: Diagnosis not present

## 2023-03-11 DIAGNOSIS — S52301D Unspecified fracture of shaft of right radius, subsequent encounter for closed fracture with routine healing: Secondary | ICD-10-CM | POA: Diagnosis not present

## 2023-03-11 DIAGNOSIS — D509 Iron deficiency anemia, unspecified: Secondary | ICD-10-CM | POA: Diagnosis not present

## 2023-03-11 DIAGNOSIS — F0390 Unspecified dementia without behavioral disturbance: Secondary | ICD-10-CM | POA: Diagnosis not present

## 2023-03-11 DIAGNOSIS — S52501A Unspecified fracture of the lower end of right radius, initial encounter for closed fracture: Secondary | ICD-10-CM | POA: Diagnosis not present

## 2023-03-11 DIAGNOSIS — S52501D Unspecified fracture of the lower end of right radius, subsequent encounter for closed fracture with routine healing: Secondary | ICD-10-CM | POA: Diagnosis not present

## 2023-03-11 DIAGNOSIS — J90 Pleural effusion, not elsewhere classified: Secondary | ICD-10-CM | POA: Diagnosis not present

## 2023-03-11 DIAGNOSIS — F419 Anxiety disorder, unspecified: Secondary | ICD-10-CM | POA: Diagnosis not present

## 2023-03-11 DIAGNOSIS — I480 Paroxysmal atrial fibrillation: Secondary | ICD-10-CM | POA: Diagnosis not present

## 2023-03-11 DIAGNOSIS — R52 Pain, unspecified: Secondary | ICD-10-CM | POA: Diagnosis not present

## 2023-03-11 DIAGNOSIS — S52611A Displaced fracture of right ulna styloid process, initial encounter for closed fracture: Secondary | ICD-10-CM | POA: Diagnosis not present

## 2023-03-11 DIAGNOSIS — M6281 Muscle weakness (generalized): Secondary | ICD-10-CM | POA: Diagnosis not present

## 2023-03-11 DIAGNOSIS — F32A Depression, unspecified: Secondary | ICD-10-CM | POA: Diagnosis not present

## 2023-03-11 DIAGNOSIS — R279 Unspecified lack of coordination: Secondary | ICD-10-CM | POA: Diagnosis not present

## 2023-03-11 DIAGNOSIS — E440 Moderate protein-calorie malnutrition: Secondary | ICD-10-CM | POA: Diagnosis not present

## 2023-03-11 DIAGNOSIS — S52301A Unspecified fracture of shaft of right radius, initial encounter for closed fracture: Secondary | ICD-10-CM | POA: Diagnosis not present

## 2023-03-11 DIAGNOSIS — N390 Urinary tract infection, site not specified: Secondary | ICD-10-CM | POA: Diagnosis not present

## 2023-03-13 DIAGNOSIS — R5381 Other malaise: Secondary | ICD-10-CM | POA: Diagnosis not present

## 2023-03-17 DIAGNOSIS — R5381 Other malaise: Secondary | ICD-10-CM | POA: Diagnosis not present

## 2023-03-18 DIAGNOSIS — S52611A Displaced fracture of right ulna styloid process, initial encounter for closed fracture: Secondary | ICD-10-CM | POA: Diagnosis not present

## 2023-03-18 DIAGNOSIS — S52301A Unspecified fracture of shaft of right radius, initial encounter for closed fracture: Secondary | ICD-10-CM | POA: Diagnosis not present

## 2023-03-18 DIAGNOSIS — S52501A Unspecified fracture of the lower end of right radius, initial encounter for closed fracture: Secondary | ICD-10-CM | POA: Diagnosis not present

## 2023-03-18 DIAGNOSIS — S52591A Other fractures of lower end of right radius, initial encounter for closed fracture: Secondary | ICD-10-CM | POA: Diagnosis not present

## 2023-03-19 DIAGNOSIS — R5381 Other malaise: Secondary | ICD-10-CM | POA: Diagnosis not present

## 2023-03-22 DIAGNOSIS — R5381 Other malaise: Secondary | ICD-10-CM | POA: Diagnosis not present

## 2023-03-24 DIAGNOSIS — R5381 Other malaise: Secondary | ICD-10-CM | POA: Diagnosis not present

## 2023-03-26 DIAGNOSIS — R5381 Other malaise: Secondary | ICD-10-CM | POA: Diagnosis not present

## 2023-03-30 DIAGNOSIS — Z7984 Long term (current) use of oral hypoglycemic drugs: Secondary | ICD-10-CM | POA: Diagnosis not present

## 2023-03-30 DIAGNOSIS — E119 Type 2 diabetes mellitus without complications: Secondary | ICD-10-CM | POA: Diagnosis not present

## 2023-03-30 DIAGNOSIS — Z9181 History of falling: Secondary | ICD-10-CM | POA: Diagnosis not present

## 2023-03-30 DIAGNOSIS — R339 Retention of urine, unspecified: Secondary | ICD-10-CM | POA: Diagnosis not present

## 2023-03-30 DIAGNOSIS — F0390 Unspecified dementia without behavioral disturbance: Secondary | ICD-10-CM | POA: Diagnosis not present

## 2023-03-30 DIAGNOSIS — I480 Paroxysmal atrial fibrillation: Secondary | ICD-10-CM | POA: Diagnosis not present

## 2023-04-02 DIAGNOSIS — E119 Type 2 diabetes mellitus without complications: Secondary | ICD-10-CM | POA: Diagnosis not present

## 2023-04-02 DIAGNOSIS — E7800 Pure hypercholesterolemia, unspecified: Secondary | ICD-10-CM | POA: Diagnosis not present

## 2023-04-02 DIAGNOSIS — Z79899 Other long term (current) drug therapy: Secondary | ICD-10-CM | POA: Diagnosis not present

## 2023-04-02 DIAGNOSIS — R1031 Right lower quadrant pain: Secondary | ICD-10-CM | POA: Diagnosis not present

## 2023-04-02 DIAGNOSIS — R1032 Left lower quadrant pain: Secondary | ICD-10-CM | POA: Diagnosis not present

## 2023-04-02 DIAGNOSIS — Z885 Allergy status to narcotic agent status: Secondary | ICD-10-CM | POA: Diagnosis not present

## 2023-04-02 DIAGNOSIS — R339 Retention of urine, unspecified: Secondary | ICD-10-CM | POA: Diagnosis not present

## 2023-04-02 DIAGNOSIS — Z7901 Long term (current) use of anticoagulants: Secondary | ICD-10-CM | POA: Diagnosis not present

## 2023-04-02 DIAGNOSIS — T83511A Infection and inflammatory reaction due to indwelling urethral catheter, initial encounter: Secondary | ICD-10-CM | POA: Diagnosis not present

## 2023-04-02 DIAGNOSIS — N390 Urinary tract infection, site not specified: Secondary | ICD-10-CM | POA: Diagnosis not present

## 2023-04-02 DIAGNOSIS — M1990 Unspecified osteoarthritis, unspecified site: Secondary | ICD-10-CM | POA: Diagnosis not present

## 2023-04-05 DIAGNOSIS — Z90710 Acquired absence of both cervix and uterus: Secondary | ICD-10-CM | POA: Diagnosis not present

## 2023-04-05 DIAGNOSIS — Z79899 Other long term (current) drug therapy: Secondary | ICD-10-CM | POA: Diagnosis not present

## 2023-04-05 DIAGNOSIS — R109 Unspecified abdominal pain: Secondary | ICD-10-CM | POA: Diagnosis not present

## 2023-04-05 DIAGNOSIS — Z792 Long term (current) use of antibiotics: Secondary | ICD-10-CM | POA: Diagnosis not present

## 2023-04-05 DIAGNOSIS — E119 Type 2 diabetes mellitus without complications: Secondary | ICD-10-CM | POA: Diagnosis not present

## 2023-04-05 DIAGNOSIS — Z885 Allergy status to narcotic agent status: Secondary | ICD-10-CM | POA: Diagnosis not present

## 2023-04-05 DIAGNOSIS — F32A Depression, unspecified: Secondary | ICD-10-CM | POA: Diagnosis not present

## 2023-04-05 DIAGNOSIS — N390 Urinary tract infection, site not specified: Secondary | ICD-10-CM | POA: Diagnosis not present

## 2023-04-05 DIAGNOSIS — E7800 Pure hypercholesterolemia, unspecified: Secondary | ICD-10-CM | POA: Diagnosis not present

## 2023-04-05 DIAGNOSIS — K5900 Constipation, unspecified: Secondary | ICD-10-CM | POA: Diagnosis not present

## 2023-04-07 DIAGNOSIS — I129 Hypertensive chronic kidney disease with stage 1 through stage 4 chronic kidney disease, or unspecified chronic kidney disease: Secondary | ICD-10-CM | POA: Diagnosis not present

## 2023-04-07 DIAGNOSIS — Z0289 Encounter for other administrative examinations: Secondary | ICD-10-CM | POA: Diagnosis not present

## 2023-04-07 DIAGNOSIS — J189 Pneumonia, unspecified organism: Secondary | ICD-10-CM | POA: Diagnosis not present

## 2023-04-07 DIAGNOSIS — F0394 Unspecified dementia, unspecified severity, with anxiety: Secondary | ICD-10-CM | POA: Diagnosis not present

## 2023-04-07 DIAGNOSIS — F0393 Unspecified dementia, unspecified severity, with mood disturbance: Secondary | ICD-10-CM | POA: Diagnosis not present

## 2023-04-07 DIAGNOSIS — F03911 Unspecified dementia, unspecified severity, with agitation: Secondary | ICD-10-CM | POA: Diagnosis not present

## 2023-04-07 DIAGNOSIS — G4700 Insomnia, unspecified: Secondary | ICD-10-CM | POA: Diagnosis not present

## 2023-04-07 DIAGNOSIS — N390 Urinary tract infection, site not specified: Secondary | ICD-10-CM | POA: Diagnosis not present

## 2023-04-07 DIAGNOSIS — S52501D Unspecified fracture of the lower end of right radius, subsequent encounter for closed fracture with routine healing: Secondary | ICD-10-CM | POA: Diagnosis not present

## 2023-04-07 DIAGNOSIS — J90 Pleural effusion, not elsewhere classified: Secondary | ICD-10-CM | POA: Diagnosis not present

## 2023-04-07 DIAGNOSIS — F32A Depression, unspecified: Secondary | ICD-10-CM | POA: Diagnosis not present

## 2023-04-08 DIAGNOSIS — S52501A Unspecified fracture of the lower end of right radius, initial encounter for closed fracture: Secondary | ICD-10-CM | POA: Diagnosis not present

## 2023-04-08 DIAGNOSIS — S52301D Unspecified fracture of shaft of right radius, subsequent encounter for closed fracture with routine healing: Secondary | ICD-10-CM | POA: Diagnosis not present

## 2023-04-08 DIAGNOSIS — S52571D Other intraarticular fracture of lower end of right radius, subsequent encounter for closed fracture with routine healing: Secondary | ICD-10-CM | POA: Diagnosis not present

## 2023-04-12 DIAGNOSIS — N390 Urinary tract infection, site not specified: Secondary | ICD-10-CM | POA: Diagnosis not present

## 2023-04-12 DIAGNOSIS — J90 Pleural effusion, not elsewhere classified: Secondary | ICD-10-CM | POA: Diagnosis not present

## 2023-04-12 DIAGNOSIS — F0394 Unspecified dementia, unspecified severity, with anxiety: Secondary | ICD-10-CM | POA: Diagnosis not present

## 2023-04-12 DIAGNOSIS — I129 Hypertensive chronic kidney disease with stage 1 through stage 4 chronic kidney disease, or unspecified chronic kidney disease: Secondary | ICD-10-CM | POA: Diagnosis not present

## 2023-04-12 DIAGNOSIS — F0393 Unspecified dementia, unspecified severity, with mood disturbance: Secondary | ICD-10-CM | POA: Diagnosis not present

## 2023-04-12 DIAGNOSIS — S52501D Unspecified fracture of the lower end of right radius, subsequent encounter for closed fracture with routine healing: Secondary | ICD-10-CM | POA: Diagnosis not present

## 2023-04-12 DIAGNOSIS — F32A Depression, unspecified: Secondary | ICD-10-CM | POA: Diagnosis not present

## 2023-04-12 DIAGNOSIS — J189 Pneumonia, unspecified organism: Secondary | ICD-10-CM | POA: Diagnosis not present

## 2023-04-12 DIAGNOSIS — F03911 Unspecified dementia, unspecified severity, with agitation: Secondary | ICD-10-CM | POA: Diagnosis not present

## 2023-04-14 DIAGNOSIS — S52501D Unspecified fracture of the lower end of right radius, subsequent encounter for closed fracture with routine healing: Secondary | ICD-10-CM | POA: Diagnosis not present

## 2023-04-14 DIAGNOSIS — F0394 Unspecified dementia, unspecified severity, with anxiety: Secondary | ICD-10-CM | POA: Diagnosis not present

## 2023-04-14 DIAGNOSIS — F32A Depression, unspecified: Secondary | ICD-10-CM | POA: Diagnosis not present

## 2023-04-14 DIAGNOSIS — F0393 Unspecified dementia, unspecified severity, with mood disturbance: Secondary | ICD-10-CM | POA: Diagnosis not present

## 2023-04-14 DIAGNOSIS — I129 Hypertensive chronic kidney disease with stage 1 through stage 4 chronic kidney disease, or unspecified chronic kidney disease: Secondary | ICD-10-CM | POA: Diagnosis not present

## 2023-04-14 DIAGNOSIS — J90 Pleural effusion, not elsewhere classified: Secondary | ICD-10-CM | POA: Diagnosis not present

## 2023-04-14 DIAGNOSIS — N390 Urinary tract infection, site not specified: Secondary | ICD-10-CM | POA: Diagnosis not present

## 2023-04-14 DIAGNOSIS — J189 Pneumonia, unspecified organism: Secondary | ICD-10-CM | POA: Diagnosis not present

## 2023-04-14 DIAGNOSIS — F03911 Unspecified dementia, unspecified severity, with agitation: Secondary | ICD-10-CM | POA: Diagnosis not present

## 2023-04-15 DIAGNOSIS — R339 Retention of urine, unspecified: Secondary | ICD-10-CM | POA: Diagnosis not present

## 2023-04-16 DIAGNOSIS — I129 Hypertensive chronic kidney disease with stage 1 through stage 4 chronic kidney disease, or unspecified chronic kidney disease: Secondary | ICD-10-CM | POA: Diagnosis not present

## 2023-04-16 DIAGNOSIS — S52501D Unspecified fracture of the lower end of right radius, subsequent encounter for closed fracture with routine healing: Secondary | ICD-10-CM | POA: Diagnosis not present

## 2023-04-16 DIAGNOSIS — F32A Depression, unspecified: Secondary | ICD-10-CM | POA: Diagnosis not present

## 2023-04-16 DIAGNOSIS — J189 Pneumonia, unspecified organism: Secondary | ICD-10-CM | POA: Diagnosis not present

## 2023-04-16 DIAGNOSIS — F0394 Unspecified dementia, unspecified severity, with anxiety: Secondary | ICD-10-CM | POA: Diagnosis not present

## 2023-04-16 DIAGNOSIS — N390 Urinary tract infection, site not specified: Secondary | ICD-10-CM | POA: Diagnosis not present

## 2023-04-16 DIAGNOSIS — F0393 Unspecified dementia, unspecified severity, with mood disturbance: Secondary | ICD-10-CM | POA: Diagnosis not present

## 2023-04-16 DIAGNOSIS — J90 Pleural effusion, not elsewhere classified: Secondary | ICD-10-CM | POA: Diagnosis not present

## 2023-04-16 DIAGNOSIS — F03911 Unspecified dementia, unspecified severity, with agitation: Secondary | ICD-10-CM | POA: Diagnosis not present

## 2023-04-20 DIAGNOSIS — F0393 Unspecified dementia, unspecified severity, with mood disturbance: Secondary | ICD-10-CM | POA: Diagnosis not present

## 2023-04-20 DIAGNOSIS — F03911 Unspecified dementia, unspecified severity, with agitation: Secondary | ICD-10-CM | POA: Diagnosis not present

## 2023-04-20 DIAGNOSIS — S52501D Unspecified fracture of the lower end of right radius, subsequent encounter for closed fracture with routine healing: Secondary | ICD-10-CM | POA: Diagnosis not present

## 2023-04-20 DIAGNOSIS — I129 Hypertensive chronic kidney disease with stage 1 through stage 4 chronic kidney disease, or unspecified chronic kidney disease: Secondary | ICD-10-CM | POA: Diagnosis not present

## 2023-04-20 DIAGNOSIS — N390 Urinary tract infection, site not specified: Secondary | ICD-10-CM | POA: Diagnosis not present

## 2023-04-20 DIAGNOSIS — F0394 Unspecified dementia, unspecified severity, with anxiety: Secondary | ICD-10-CM | POA: Diagnosis not present

## 2023-04-20 DIAGNOSIS — J189 Pneumonia, unspecified organism: Secondary | ICD-10-CM | POA: Diagnosis not present

## 2023-04-20 DIAGNOSIS — F32A Depression, unspecified: Secondary | ICD-10-CM | POA: Diagnosis not present

## 2023-04-20 DIAGNOSIS — J90 Pleural effusion, not elsewhere classified: Secondary | ICD-10-CM | POA: Diagnosis not present

## 2023-04-21 DIAGNOSIS — F0393 Unspecified dementia, unspecified severity, with mood disturbance: Secondary | ICD-10-CM | POA: Diagnosis not present

## 2023-04-21 DIAGNOSIS — S52501D Unspecified fracture of the lower end of right radius, subsequent encounter for closed fracture with routine healing: Secondary | ICD-10-CM | POA: Diagnosis not present

## 2023-04-21 DIAGNOSIS — N390 Urinary tract infection, site not specified: Secondary | ICD-10-CM | POA: Diagnosis not present

## 2023-04-21 DIAGNOSIS — J90 Pleural effusion, not elsewhere classified: Secondary | ICD-10-CM | POA: Diagnosis not present

## 2023-04-21 DIAGNOSIS — F0394 Unspecified dementia, unspecified severity, with anxiety: Secondary | ICD-10-CM | POA: Diagnosis not present

## 2023-04-21 DIAGNOSIS — I129 Hypertensive chronic kidney disease with stage 1 through stage 4 chronic kidney disease, or unspecified chronic kidney disease: Secondary | ICD-10-CM | POA: Diagnosis not present

## 2023-04-21 DIAGNOSIS — J189 Pneumonia, unspecified organism: Secondary | ICD-10-CM | POA: Diagnosis not present

## 2023-04-21 DIAGNOSIS — F32A Depression, unspecified: Secondary | ICD-10-CM | POA: Diagnosis not present

## 2023-04-21 DIAGNOSIS — F03911 Unspecified dementia, unspecified severity, with agitation: Secondary | ICD-10-CM | POA: Diagnosis not present

## 2023-04-26 DIAGNOSIS — J90 Pleural effusion, not elsewhere classified: Secondary | ICD-10-CM | POA: Diagnosis not present

## 2023-04-26 DIAGNOSIS — F0394 Unspecified dementia, unspecified severity, with anxiety: Secondary | ICD-10-CM | POA: Diagnosis not present

## 2023-04-26 DIAGNOSIS — F0393 Unspecified dementia, unspecified severity, with mood disturbance: Secondary | ICD-10-CM | POA: Diagnosis not present

## 2023-04-26 DIAGNOSIS — N390 Urinary tract infection, site not specified: Secondary | ICD-10-CM | POA: Diagnosis not present

## 2023-04-26 DIAGNOSIS — S52501D Unspecified fracture of the lower end of right radius, subsequent encounter for closed fracture with routine healing: Secondary | ICD-10-CM | POA: Diagnosis not present

## 2023-04-26 DIAGNOSIS — I129 Hypertensive chronic kidney disease with stage 1 through stage 4 chronic kidney disease, or unspecified chronic kidney disease: Secondary | ICD-10-CM | POA: Diagnosis not present

## 2023-04-26 DIAGNOSIS — J189 Pneumonia, unspecified organism: Secondary | ICD-10-CM | POA: Diagnosis not present

## 2023-04-26 DIAGNOSIS — F32A Depression, unspecified: Secondary | ICD-10-CM | POA: Diagnosis not present

## 2023-04-26 DIAGNOSIS — F03911 Unspecified dementia, unspecified severity, with agitation: Secondary | ICD-10-CM | POA: Diagnosis not present

## 2023-04-27 DIAGNOSIS — I480 Paroxysmal atrial fibrillation: Secondary | ICD-10-CM | POA: Diagnosis not present

## 2023-04-27 DIAGNOSIS — R001 Bradycardia, unspecified: Secondary | ICD-10-CM | POA: Diagnosis not present

## 2023-04-27 DIAGNOSIS — Z7901 Long term (current) use of anticoagulants: Secondary | ICD-10-CM | POA: Diagnosis not present

## 2023-04-27 DIAGNOSIS — I441 Atrioventricular block, second degree: Secondary | ICD-10-CM | POA: Diagnosis not present

## 2023-04-27 DIAGNOSIS — I4891 Unspecified atrial fibrillation: Secondary | ICD-10-CM | POA: Diagnosis not present

## 2023-04-27 DIAGNOSIS — Z79899 Other long term (current) drug therapy: Secondary | ICD-10-CM | POA: Diagnosis not present

## 2023-04-28 DIAGNOSIS — F0393 Unspecified dementia, unspecified severity, with mood disturbance: Secondary | ICD-10-CM | POA: Diagnosis not present

## 2023-04-28 DIAGNOSIS — J189 Pneumonia, unspecified organism: Secondary | ICD-10-CM | POA: Diagnosis not present

## 2023-04-28 DIAGNOSIS — N390 Urinary tract infection, site not specified: Secondary | ICD-10-CM | POA: Diagnosis not present

## 2023-04-28 DIAGNOSIS — S52501D Unspecified fracture of the lower end of right radius, subsequent encounter for closed fracture with routine healing: Secondary | ICD-10-CM | POA: Diagnosis not present

## 2023-04-28 DIAGNOSIS — J90 Pleural effusion, not elsewhere classified: Secondary | ICD-10-CM | POA: Diagnosis not present

## 2023-04-28 DIAGNOSIS — I129 Hypertensive chronic kidney disease with stage 1 through stage 4 chronic kidney disease, or unspecified chronic kidney disease: Secondary | ICD-10-CM | POA: Diagnosis not present

## 2023-04-28 DIAGNOSIS — F32A Depression, unspecified: Secondary | ICD-10-CM | POA: Diagnosis not present

## 2023-04-28 DIAGNOSIS — F03911 Unspecified dementia, unspecified severity, with agitation: Secondary | ICD-10-CM | POA: Diagnosis not present

## 2023-04-28 DIAGNOSIS — F0394 Unspecified dementia, unspecified severity, with anxiety: Secondary | ICD-10-CM | POA: Diagnosis not present

## 2023-04-29 DIAGNOSIS — I480 Paroxysmal atrial fibrillation: Secondary | ICD-10-CM | POA: Diagnosis not present

## 2023-04-29 DIAGNOSIS — M810 Age-related osteoporosis without current pathological fracture: Secondary | ICD-10-CM | POA: Diagnosis not present

## 2023-04-29 DIAGNOSIS — E119 Type 2 diabetes mellitus without complications: Secondary | ICD-10-CM | POA: Diagnosis not present

## 2023-04-29 DIAGNOSIS — D649 Anemia, unspecified: Secondary | ICD-10-CM | POA: Diagnosis not present

## 2023-05-04 DIAGNOSIS — J189 Pneumonia, unspecified organism: Secondary | ICD-10-CM | POA: Diagnosis not present

## 2023-05-04 DIAGNOSIS — J90 Pleural effusion, not elsewhere classified: Secondary | ICD-10-CM | POA: Diagnosis not present

## 2023-05-04 DIAGNOSIS — F0393 Unspecified dementia, unspecified severity, with mood disturbance: Secondary | ICD-10-CM | POA: Diagnosis not present

## 2023-05-04 DIAGNOSIS — F32A Depression, unspecified: Secondary | ICD-10-CM | POA: Diagnosis not present

## 2023-05-04 DIAGNOSIS — F03911 Unspecified dementia, unspecified severity, with agitation: Secondary | ICD-10-CM | POA: Diagnosis not present

## 2023-05-04 DIAGNOSIS — N390 Urinary tract infection, site not specified: Secondary | ICD-10-CM | POA: Diagnosis not present

## 2023-05-04 DIAGNOSIS — I129 Hypertensive chronic kidney disease with stage 1 through stage 4 chronic kidney disease, or unspecified chronic kidney disease: Secondary | ICD-10-CM | POA: Diagnosis not present

## 2023-05-04 DIAGNOSIS — F0394 Unspecified dementia, unspecified severity, with anxiety: Secondary | ICD-10-CM | POA: Diagnosis not present

## 2023-05-04 DIAGNOSIS — S52501D Unspecified fracture of the lower end of right radius, subsequent encounter for closed fracture with routine healing: Secondary | ICD-10-CM | POA: Diagnosis not present

## 2023-05-05 DIAGNOSIS — J189 Pneumonia, unspecified organism: Secondary | ICD-10-CM | POA: Diagnosis not present

## 2023-05-05 DIAGNOSIS — F03911 Unspecified dementia, unspecified severity, with agitation: Secondary | ICD-10-CM | POA: Diagnosis not present

## 2023-05-05 DIAGNOSIS — F0393 Unspecified dementia, unspecified severity, with mood disturbance: Secondary | ICD-10-CM | POA: Diagnosis not present

## 2023-05-05 DIAGNOSIS — I129 Hypertensive chronic kidney disease with stage 1 through stage 4 chronic kidney disease, or unspecified chronic kidney disease: Secondary | ICD-10-CM | POA: Diagnosis not present

## 2023-05-05 DIAGNOSIS — J90 Pleural effusion, not elsewhere classified: Secondary | ICD-10-CM | POA: Diagnosis not present

## 2023-05-05 DIAGNOSIS — F0394 Unspecified dementia, unspecified severity, with anxiety: Secondary | ICD-10-CM | POA: Diagnosis not present

## 2023-05-05 DIAGNOSIS — F32A Depression, unspecified: Secondary | ICD-10-CM | POA: Diagnosis not present

## 2023-05-05 DIAGNOSIS — S52501D Unspecified fracture of the lower end of right radius, subsequent encounter for closed fracture with routine healing: Secondary | ICD-10-CM | POA: Diagnosis not present

## 2023-05-05 DIAGNOSIS — N390 Urinary tract infection, site not specified: Secondary | ICD-10-CM | POA: Diagnosis not present

## 2023-05-10 DIAGNOSIS — I129 Hypertensive chronic kidney disease with stage 1 through stage 4 chronic kidney disease, or unspecified chronic kidney disease: Secondary | ICD-10-CM | POA: Diagnosis not present

## 2023-05-10 DIAGNOSIS — N390 Urinary tract infection, site not specified: Secondary | ICD-10-CM | POA: Diagnosis not present

## 2023-05-10 DIAGNOSIS — J90 Pleural effusion, not elsewhere classified: Secondary | ICD-10-CM | POA: Diagnosis not present

## 2023-05-10 DIAGNOSIS — F32A Depression, unspecified: Secondary | ICD-10-CM | POA: Diagnosis not present

## 2023-05-10 DIAGNOSIS — F0394 Unspecified dementia, unspecified severity, with anxiety: Secondary | ICD-10-CM | POA: Diagnosis not present

## 2023-05-10 DIAGNOSIS — S52501D Unspecified fracture of the lower end of right radius, subsequent encounter for closed fracture with routine healing: Secondary | ICD-10-CM | POA: Diagnosis not present

## 2023-05-10 DIAGNOSIS — J189 Pneumonia, unspecified organism: Secondary | ICD-10-CM | POA: Diagnosis not present

## 2023-05-10 DIAGNOSIS — F0393 Unspecified dementia, unspecified severity, with mood disturbance: Secondary | ICD-10-CM | POA: Diagnosis not present

## 2023-05-10 DIAGNOSIS — F03911 Unspecified dementia, unspecified severity, with agitation: Secondary | ICD-10-CM | POA: Diagnosis not present

## 2023-05-11 DIAGNOSIS — F0393 Unspecified dementia, unspecified severity, with mood disturbance: Secondary | ICD-10-CM | POA: Diagnosis not present

## 2023-05-11 DIAGNOSIS — J189 Pneumonia, unspecified organism: Secondary | ICD-10-CM | POA: Diagnosis not present

## 2023-05-11 DIAGNOSIS — F03911 Unspecified dementia, unspecified severity, with agitation: Secondary | ICD-10-CM | POA: Diagnosis not present

## 2023-05-11 DIAGNOSIS — S52501D Unspecified fracture of the lower end of right radius, subsequent encounter for closed fracture with routine healing: Secondary | ICD-10-CM | POA: Diagnosis not present

## 2023-05-11 DIAGNOSIS — F32A Depression, unspecified: Secondary | ICD-10-CM | POA: Diagnosis not present

## 2023-05-11 DIAGNOSIS — I129 Hypertensive chronic kidney disease with stage 1 through stage 4 chronic kidney disease, or unspecified chronic kidney disease: Secondary | ICD-10-CM | POA: Diagnosis not present

## 2023-05-11 DIAGNOSIS — F0394 Unspecified dementia, unspecified severity, with anxiety: Secondary | ICD-10-CM | POA: Diagnosis not present

## 2023-05-11 DIAGNOSIS — J90 Pleural effusion, not elsewhere classified: Secondary | ICD-10-CM | POA: Diagnosis not present

## 2023-05-11 DIAGNOSIS — N390 Urinary tract infection, site not specified: Secondary | ICD-10-CM | POA: Diagnosis not present

## 2023-05-12 DIAGNOSIS — S52501D Unspecified fracture of the lower end of right radius, subsequent encounter for closed fracture with routine healing: Secondary | ICD-10-CM | POA: Diagnosis not present

## 2023-05-12 DIAGNOSIS — F32A Depression, unspecified: Secondary | ICD-10-CM | POA: Diagnosis not present

## 2023-05-12 DIAGNOSIS — F03911 Unspecified dementia, unspecified severity, with agitation: Secondary | ICD-10-CM | POA: Diagnosis not present

## 2023-05-12 DIAGNOSIS — F0393 Unspecified dementia, unspecified severity, with mood disturbance: Secondary | ICD-10-CM | POA: Diagnosis not present

## 2023-05-12 DIAGNOSIS — I129 Hypertensive chronic kidney disease with stage 1 through stage 4 chronic kidney disease, or unspecified chronic kidney disease: Secondary | ICD-10-CM | POA: Diagnosis not present

## 2023-05-12 DIAGNOSIS — J189 Pneumonia, unspecified organism: Secondary | ICD-10-CM | POA: Diagnosis not present

## 2023-05-12 DIAGNOSIS — F0394 Unspecified dementia, unspecified severity, with anxiety: Secondary | ICD-10-CM | POA: Diagnosis not present

## 2023-05-12 DIAGNOSIS — J90 Pleural effusion, not elsewhere classified: Secondary | ICD-10-CM | POA: Diagnosis not present

## 2023-05-12 DIAGNOSIS — N390 Urinary tract infection, site not specified: Secondary | ICD-10-CM | POA: Diagnosis not present

## 2023-05-19 DIAGNOSIS — Z7901 Long term (current) use of anticoagulants: Secondary | ICD-10-CM | POA: Diagnosis not present

## 2023-05-19 DIAGNOSIS — I4891 Unspecified atrial fibrillation: Secondary | ICD-10-CM | POA: Diagnosis not present

## 2023-05-19 DIAGNOSIS — J189 Pneumonia, unspecified organism: Secondary | ICD-10-CM | POA: Diagnosis not present

## 2023-05-19 DIAGNOSIS — K5900 Constipation, unspecified: Secondary | ICD-10-CM | POA: Diagnosis not present

## 2023-05-19 DIAGNOSIS — Z885 Allergy status to narcotic agent status: Secondary | ICD-10-CM | POA: Diagnosis not present

## 2023-05-19 DIAGNOSIS — E7800 Pure hypercholesterolemia, unspecified: Secondary | ICD-10-CM | POA: Diagnosis not present

## 2023-05-19 DIAGNOSIS — F0393 Unspecified dementia, unspecified severity, with mood disturbance: Secondary | ICD-10-CM | POA: Diagnosis not present

## 2023-05-19 DIAGNOSIS — J90 Pleural effusion, not elsewhere classified: Secondary | ICD-10-CM | POA: Diagnosis not present

## 2023-05-19 DIAGNOSIS — I129 Hypertensive chronic kidney disease with stage 1 through stage 4 chronic kidney disease, or unspecified chronic kidney disease: Secondary | ICD-10-CM | POA: Diagnosis not present

## 2023-05-19 DIAGNOSIS — S52501D Unspecified fracture of the lower end of right radius, subsequent encounter for closed fracture with routine healing: Secondary | ICD-10-CM | POA: Diagnosis not present

## 2023-05-19 DIAGNOSIS — N390 Urinary tract infection, site not specified: Secondary | ICD-10-CM | POA: Diagnosis not present

## 2023-05-19 DIAGNOSIS — K219 Gastro-esophageal reflux disease without esophagitis: Secondary | ICD-10-CM | POA: Diagnosis not present

## 2023-05-19 DIAGNOSIS — F03911 Unspecified dementia, unspecified severity, with agitation: Secondary | ICD-10-CM | POA: Diagnosis not present

## 2023-05-19 DIAGNOSIS — F32A Depression, unspecified: Secondary | ICD-10-CM | POA: Diagnosis not present

## 2023-05-19 DIAGNOSIS — E119 Type 2 diabetes mellitus without complications: Secondary | ICD-10-CM | POA: Diagnosis not present

## 2023-05-19 DIAGNOSIS — R339 Retention of urine, unspecified: Secondary | ICD-10-CM | POA: Diagnosis not present

## 2023-05-19 DIAGNOSIS — F0394 Unspecified dementia, unspecified severity, with anxiety: Secondary | ICD-10-CM | POA: Diagnosis not present

## 2023-05-19 DIAGNOSIS — R140 Abdominal distension (gaseous): Secondary | ICD-10-CM | POA: Diagnosis not present

## 2023-05-19 DIAGNOSIS — Z79899 Other long term (current) drug therapy: Secondary | ICD-10-CM | POA: Diagnosis not present

## 2023-05-25 DIAGNOSIS — F0394 Unspecified dementia, unspecified severity, with anxiety: Secondary | ICD-10-CM | POA: Diagnosis not present

## 2023-05-25 DIAGNOSIS — N390 Urinary tract infection, site not specified: Secondary | ICD-10-CM | POA: Diagnosis not present

## 2023-05-25 DIAGNOSIS — J90 Pleural effusion, not elsewhere classified: Secondary | ICD-10-CM | POA: Diagnosis not present

## 2023-05-25 DIAGNOSIS — S52501D Unspecified fracture of the lower end of right radius, subsequent encounter for closed fracture with routine healing: Secondary | ICD-10-CM | POA: Diagnosis not present

## 2023-05-25 DIAGNOSIS — F32A Depression, unspecified: Secondary | ICD-10-CM | POA: Diagnosis not present

## 2023-05-25 DIAGNOSIS — I129 Hypertensive chronic kidney disease with stage 1 through stage 4 chronic kidney disease, or unspecified chronic kidney disease: Secondary | ICD-10-CM | POA: Diagnosis not present

## 2023-05-25 DIAGNOSIS — F03911 Unspecified dementia, unspecified severity, with agitation: Secondary | ICD-10-CM | POA: Diagnosis not present

## 2023-05-25 DIAGNOSIS — F0393 Unspecified dementia, unspecified severity, with mood disturbance: Secondary | ICD-10-CM | POA: Diagnosis not present

## 2023-05-25 DIAGNOSIS — J189 Pneumonia, unspecified organism: Secondary | ICD-10-CM | POA: Diagnosis not present

## 2023-05-26 DIAGNOSIS — F03911 Unspecified dementia, unspecified severity, with agitation: Secondary | ICD-10-CM | POA: Diagnosis not present

## 2023-05-26 DIAGNOSIS — F32A Depression, unspecified: Secondary | ICD-10-CM | POA: Diagnosis not present

## 2023-05-26 DIAGNOSIS — F0393 Unspecified dementia, unspecified severity, with mood disturbance: Secondary | ICD-10-CM | POA: Diagnosis not present

## 2023-05-26 DIAGNOSIS — S52501D Unspecified fracture of the lower end of right radius, subsequent encounter for closed fracture with routine healing: Secondary | ICD-10-CM | POA: Diagnosis not present

## 2023-05-26 DIAGNOSIS — I129 Hypertensive chronic kidney disease with stage 1 through stage 4 chronic kidney disease, or unspecified chronic kidney disease: Secondary | ICD-10-CM | POA: Diagnosis not present

## 2023-05-26 DIAGNOSIS — J189 Pneumonia, unspecified organism: Secondary | ICD-10-CM | POA: Diagnosis not present

## 2023-05-26 DIAGNOSIS — J90 Pleural effusion, not elsewhere classified: Secondary | ICD-10-CM | POA: Diagnosis not present

## 2023-05-26 DIAGNOSIS — F0394 Unspecified dementia, unspecified severity, with anxiety: Secondary | ICD-10-CM | POA: Diagnosis not present

## 2023-05-26 DIAGNOSIS — N390 Urinary tract infection, site not specified: Secondary | ICD-10-CM | POA: Diagnosis not present

## 2023-06-01 DIAGNOSIS — I129 Hypertensive chronic kidney disease with stage 1 through stage 4 chronic kidney disease, or unspecified chronic kidney disease: Secondary | ICD-10-CM | POA: Diagnosis not present

## 2023-06-01 DIAGNOSIS — F0394 Unspecified dementia, unspecified severity, with anxiety: Secondary | ICD-10-CM | POA: Diagnosis not present

## 2023-06-01 DIAGNOSIS — J90 Pleural effusion, not elsewhere classified: Secondary | ICD-10-CM | POA: Diagnosis not present

## 2023-06-01 DIAGNOSIS — J189 Pneumonia, unspecified organism: Secondary | ICD-10-CM | POA: Diagnosis not present

## 2023-06-01 DIAGNOSIS — S52501D Unspecified fracture of the lower end of right radius, subsequent encounter for closed fracture with routine healing: Secondary | ICD-10-CM | POA: Diagnosis not present

## 2023-06-01 DIAGNOSIS — N390 Urinary tract infection, site not specified: Secondary | ICD-10-CM | POA: Diagnosis not present

## 2023-06-01 DIAGNOSIS — F0393 Unspecified dementia, unspecified severity, with mood disturbance: Secondary | ICD-10-CM | POA: Diagnosis not present

## 2023-06-01 DIAGNOSIS — F03911 Unspecified dementia, unspecified severity, with agitation: Secondary | ICD-10-CM | POA: Diagnosis not present

## 2023-06-01 DIAGNOSIS — F32A Depression, unspecified: Secondary | ICD-10-CM | POA: Diagnosis not present

## 2024-03-28 DEATH — deceased
